# Patient Record
Sex: Female | Born: 1935
Health system: Southern US, Community
[De-identification: ages and names within clinical notes are randomized; demographics above are authoritative.]

## PROBLEM LIST (undated history)

## (undated) DIAGNOSIS — I639 Cerebral infarction, unspecified: Secondary | ICD-10-CM

## (undated) DIAGNOSIS — M1990 Unspecified osteoarthritis, unspecified site: Secondary | ICD-10-CM

## (undated) DIAGNOSIS — M810 Age-related osteoporosis without current pathological fracture: Secondary | ICD-10-CM

## (undated) DIAGNOSIS — I10 Essential (primary) hypertension: Secondary | ICD-10-CM

## (undated) DIAGNOSIS — M109 Gout, unspecified: Secondary | ICD-10-CM

## (undated) DIAGNOSIS — J45909 Unspecified asthma, uncomplicated: Secondary | ICD-10-CM

## (undated) DIAGNOSIS — K5790 Diverticulosis of intestine, part unspecified, without perforation or abscess without bleeding: Secondary | ICD-10-CM

## (undated) HISTORY — DX: Age-related osteoporosis without current pathological fracture: M81.0

## (undated) HISTORY — PX: APPENDECTOMY: SHX54

## (undated) HISTORY — DX: Gout, unspecified: M10.9

## (undated) HISTORY — DX: Unspecified asthma, uncomplicated: J45.909

## (undated) HISTORY — DX: Diverticulosis of intestine, part unspecified, without perforation or abscess without bleeding: K57.90

---

## 1999-05-27 ENCOUNTER — Encounter: Admission: RE | Admit: 1999-05-27 | Discharge: 1999-05-27 | Payer: Self-pay | Admitting: Family Medicine

## 1999-05-27 ENCOUNTER — Encounter: Payer: Self-pay | Admitting: Family Medicine

## 2001-01-15 ENCOUNTER — Encounter: Admission: RE | Admit: 2001-01-15 | Discharge: 2001-01-15 | Payer: Self-pay | Admitting: Family Medicine

## 2001-01-15 ENCOUNTER — Encounter: Payer: Self-pay | Admitting: Family Medicine

## 2001-03-05 ENCOUNTER — Encounter: Admission: RE | Admit: 2001-03-05 | Discharge: 2001-06-03 | Payer: Self-pay | Admitting: Family Medicine

## 2002-03-08 ENCOUNTER — Encounter: Payer: Self-pay | Admitting: Family Medicine

## 2002-03-08 ENCOUNTER — Encounter: Admission: RE | Admit: 2002-03-08 | Discharge: 2002-03-08 | Payer: Self-pay | Admitting: Family Medicine

## 2003-01-08 ENCOUNTER — Encounter: Admission: RE | Admit: 2003-01-08 | Discharge: 2003-01-08 | Payer: Self-pay | Admitting: Family Medicine

## 2003-01-08 ENCOUNTER — Encounter: Payer: Self-pay | Admitting: Family Medicine

## 2003-03-19 ENCOUNTER — Encounter: Admission: RE | Admit: 2003-03-19 | Discharge: 2003-03-19 | Payer: Self-pay | Admitting: Family Medicine

## 2003-03-19 ENCOUNTER — Encounter: Payer: Self-pay | Admitting: Family Medicine

## 2004-04-02 ENCOUNTER — Encounter: Admission: RE | Admit: 2004-04-02 | Discharge: 2004-04-02 | Payer: Self-pay | Admitting: Family Medicine

## 2004-05-10 ENCOUNTER — Other Ambulatory Visit: Admission: RE | Admit: 2004-05-10 | Discharge: 2004-05-10 | Payer: Self-pay | Admitting: Obstetrics and Gynecology

## 2004-05-18 HISTORY — PX: COLONOSCOPY: SHX174

## 2004-05-26 ENCOUNTER — Ambulatory Visit: Payer: Self-pay | Admitting: Gastroenterology

## 2005-07-05 ENCOUNTER — Encounter: Admission: RE | Admit: 2005-07-05 | Discharge: 2005-07-05 | Payer: Self-pay | Admitting: Family Medicine

## 2006-07-06 ENCOUNTER — Encounter: Admission: RE | Admit: 2006-07-06 | Discharge: 2006-07-06 | Payer: Self-pay | Admitting: Family Medicine

## 2006-10-12 ENCOUNTER — Emergency Department (HOSPITAL_COMMUNITY): Admission: EM | Admit: 2006-10-12 | Discharge: 2006-10-12 | Payer: Self-pay | Admitting: Emergency Medicine

## 2007-07-18 ENCOUNTER — Encounter: Admission: RE | Admit: 2007-07-18 | Discharge: 2007-07-18 | Payer: Self-pay | Admitting: Family Medicine

## 2008-08-05 ENCOUNTER — Encounter: Admission: RE | Admit: 2008-08-05 | Discharge: 2008-08-05 | Payer: Self-pay | Admitting: Emergency Medicine

## 2009-09-22 ENCOUNTER — Encounter: Admission: RE | Admit: 2009-09-22 | Discharge: 2009-09-22 | Payer: Self-pay | Admitting: Emergency Medicine

## 2010-06-22 ENCOUNTER — Other Ambulatory Visit
Admission: RE | Admit: 2010-06-22 | Discharge: 2010-06-22 | Payer: Self-pay | Source: Home / Self Care | Admitting: Obstetrics and Gynecology

## 2010-11-19 ENCOUNTER — Other Ambulatory Visit: Payer: Self-pay | Admitting: Family Medicine

## 2010-11-19 DIAGNOSIS — Z1231 Encounter for screening mammogram for malignant neoplasm of breast: Secondary | ICD-10-CM

## 2010-11-25 ENCOUNTER — Ambulatory Visit
Admission: RE | Admit: 2010-11-25 | Discharge: 2010-11-25 | Disposition: A | Discharge status: Home / Self Care | Payer: Medicare Other | Source: Ambulatory Visit | Attending: Family Medicine | Admitting: Family Medicine

## 2010-11-25 DIAGNOSIS — Z1231 Encounter for screening mammogram for malignant neoplasm of breast: Secondary | ICD-10-CM

## 2011-06-15 ENCOUNTER — Encounter: Payer: Self-pay | Admitting: Gastroenterology

## 2012-01-06 ENCOUNTER — Other Ambulatory Visit: Payer: Self-pay | Admitting: Family Medicine

## 2012-01-06 DIAGNOSIS — Z1231 Encounter for screening mammogram for malignant neoplasm of breast: Secondary | ICD-10-CM

## 2012-01-27 ENCOUNTER — Ambulatory Visit: Payer: Medicare Other

## 2012-04-14 ENCOUNTER — Emergency Department (INDEPENDENT_AMBULATORY_CARE_PROVIDER_SITE_OTHER)
Admission: EM | Admit: 2012-04-14 | Discharge: 2012-04-14 | Disposition: A | Discharge status: Home / Self Care | Payer: Medicare Other | Source: Home / Self Care

## 2012-04-14 ENCOUNTER — Encounter (HOSPITAL_COMMUNITY): Payer: Self-pay | Admitting: Emergency Medicine

## 2012-04-14 ENCOUNTER — Emergency Department (INDEPENDENT_AMBULATORY_CARE_PROVIDER_SITE_OTHER): Payer: Medicare Other

## 2012-04-14 DIAGNOSIS — M542 Cervicalgia: Secondary | ICD-10-CM

## 2012-04-14 DIAGNOSIS — M549 Dorsalgia, unspecified: Secondary | ICD-10-CM

## 2012-04-14 HISTORY — DX: Essential (primary) hypertension: I10

## 2012-04-14 HISTORY — DX: Unspecified osteoarthritis, unspecified site: M19.90

## 2012-04-14 MED ORDER — MELOXICAM 7.5 MG PO TABS: 7.5000 mg | ORAL_TABLET | Freq: Every day | ORAL | Status: DC

## 2012-04-14 MED ORDER — CYCLOBENZAPRINE HCL 7.5 MG PO TABS: 7.5000 mg | ORAL_TABLET | Freq: Two times a day (BID) | ORAL | Status: DC | PRN

## 2012-04-14 MED ORDER — TRAMADOL HCL 50 MG PO TABS: 50.0000 mg | ORAL_TABLET | Freq: Four times a day (QID) | ORAL | Status: DC | PRN
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
## 2012-04-14 NOTE — ED Provider Notes (Signed)
Medical screening examination/treatment/procedure(s) were performed by resident physician or non-physician practitioner and as supervising physician I was immediately available for consultation/collaboration.   Joetta Delprado DOUGLAS MD.    Harmonii Karle D Thia Olesen, MD 04/14/12 1920 

## 2013-07-31 ENCOUNTER — Other Ambulatory Visit: Payer: Self-pay | Admitting: Family Medicine

## 2013-07-31 ENCOUNTER — Other Ambulatory Visit (HOSPITAL_COMMUNITY)
Admission: RE | Admit: 2013-07-31 | Discharge: 2013-07-31 | Disposition: A | Discharge status: Home / Self Care | Payer: Medicare FFS | Source: Ambulatory Visit | Attending: Family Medicine | Admitting: Family Medicine

## 2013-07-31 DIAGNOSIS — Z124 Encounter for screening for malignant neoplasm of cervix: Secondary | ICD-10-CM | POA: Insufficient documentation

## 2013-07-31 DIAGNOSIS — Z1151 Encounter for screening for human papillomavirus (HPV): Secondary | ICD-10-CM | POA: Insufficient documentation

## 2014-03-07 ENCOUNTER — Other Ambulatory Visit: Payer: Self-pay | Admitting: Family Medicine

## 2014-03-07 ENCOUNTER — Ambulatory Visit
Admission: RE | Admit: 2014-03-07 | Discharge: 2014-03-07 | Disposition: A | Discharge status: Home / Self Care | Payer: Medicare FFS | Source: Ambulatory Visit | Attending: Family Medicine | Admitting: Family Medicine

## 2014-03-07 DIAGNOSIS — R439 Unspecified disturbances of smell and taste: Secondary | ICD-10-CM

## 2014-03-12 ENCOUNTER — Encounter: Payer: Self-pay | Admitting: Gastroenterology

## 2014-03-28 ENCOUNTER — Encounter: Payer: Self-pay | Admitting: Gastroenterology

## 2014-05-06 ENCOUNTER — Encounter: Payer: Self-pay | Admitting: *Deleted

## 2014-05-14 ENCOUNTER — Ambulatory Visit (AMBULATORY_SURGERY_CENTER): Payer: Medicare FFS | Admitting: *Deleted

## 2014-05-14 VITALS — Ht 65.0 in | Wt 190.0 lb

## 2014-05-14 DIAGNOSIS — Z1211 Encounter for screening for malignant neoplasm of colon: Secondary | ICD-10-CM

## 2014-05-14 MED ORDER — NA SULFATE-K SULFATE-MG SULF 17.5-3.13-1.6 GM/177ML PO SOLN: ORAL | Status: DC

## 2014-05-14 NOTE — Progress Notes (Signed)
Patient denies any allergies to eggs or soy. Patient denies any problems with anesthesia/sedation. Patient denies any oxygen use at home and does not take any diet/weight loss medications. EMMI education assisgned to patient on colonoscopy, this was explained and instructions given to patient. 

## 2014-05-28 ENCOUNTER — Encounter: Payer: Medicare FFS | Admitting: Gastroenterology

## 2014-06-05 ENCOUNTER — Encounter: Payer: Self-pay | Admitting: Gastroenterology

## 2014-08-07 ENCOUNTER — Encounter: Payer: Medicare FFS | Admitting: Gastroenterology

## 2014-08-21 ENCOUNTER — Ambulatory Visit (AMBULATORY_SURGERY_CENTER): Payer: Self-pay | Admitting: *Deleted

## 2014-08-21 VITALS — Ht 65.0 in | Wt 183.8 lb

## 2014-08-21 DIAGNOSIS — Z1211 Encounter for screening for malignant neoplasm of colon: Secondary | ICD-10-CM

## 2014-08-21 NOTE — Progress Notes (Signed)
No egg or soy allergy No diet pills No home 02 use No issues with past sedation Has emmi video from last PV Pt has a suprep at home from 04-2014 PV

## 2014-09-04 ENCOUNTER — Encounter: Payer: Self-pay | Admitting: Gastroenterology

## 2014-09-04 ENCOUNTER — Ambulatory Visit (AMBULATORY_SURGERY_CENTER): Payer: Medicare FFS | Admitting: Gastroenterology

## 2014-09-04 VITALS — BP 113/59 | HR 51 | Temp 98.0°F | Resp 16 | Ht 65.0 in | Wt 183.0 lb

## 2014-09-04 DIAGNOSIS — K648 Other hemorrhoids: Secondary | ICD-10-CM

## 2014-09-04 DIAGNOSIS — D125 Benign neoplasm of sigmoid colon: Secondary | ICD-10-CM

## 2014-09-04 DIAGNOSIS — Z1211 Encounter for screening for malignant neoplasm of colon: Secondary | ICD-10-CM

## 2014-09-04 DIAGNOSIS — K5730 Diverticulosis of large intestine without perforation or abscess without bleeding: Secondary | ICD-10-CM

## 2014-09-04 MED ORDER — SODIUM CHLORIDE 0.9 % IV SOLN: 500.0000 mL | INTRAVENOUS | Status: DC

## 2014-09-04 NOTE — Op Note (Signed)
[place]  [address] [place], [address]   COLONOSCOPY PROCEDURE REPORT  PATIENT: Kelly, Bates  MR#: [id number] BIRTHDATE: 1935-12-04 , 79  yrs. old GENDER: female ENDOSCOPIST: Inda Castle, MD REFERRED BY: PROCEDURE DATE:  09/04/2014 PROCEDURE:   Colonoscopy with snare polypectomy and Colonoscopy with control of bleeding First Screening Colonoscopy - Avg.  risk and is 50 yrs.  old or older - No.  Prior Negative Screening - Now for repeat screening. 10 or more years since last screening  History of Adenoma - Now for follow-up colonoscopy & has been > or = to 3 yrs.  N/A  Polyps Removed Today? Yes. ASA CLASS:   Class II INDICATIONS:average risk patient for colon cancer. MEDICATIONS: Monitored anesthesia care and Propofol 280 mg IV  DESCRIPTION OF PROCEDURE:   After the risks benefits and alternatives of the procedure were thoroughly explained, informed consent was obtained.  The digital rectal exam revealed no abnormalities of the rectum.   The LB [id number]  endoscope was introduced through the anus and advanced to the cecum, which was identified by both the appendix and ileocecal valve. No adverse events experienced.   The quality of the prep was excellent using Suprep  The instrument was then slowly withdrawn as the colon was fully examined.      COLON FINDINGS: A sessile polyp measuring 15 mm in size was found in the sigmoid colon.  A polypectomy was performed with a cold snare. The resection was complete, the polyp tissue was completely retrieved and sent to histology. There was a moderate amount of pulsatibve bleeding at the polypectomy site.   Bleeding at the site was controlled using 2 (first clip deployed did not adhere). hemoclips.  One (1) placement was made.  There was minimal blood loss from maneuver subsiding by end of procedure.   There was mild diverticulosis noted at the cecum, in the ascending colon, and transverse  colon.   There was mild diverticulosis noted in the sigmoid colon.   Internal hemorrhoids were found.  Retroflexed views revealed no abnormalities. The time to cecum=4 minutes 11 seconds.  Withdrawal time=13 minutes 21 seconds.  The scope was withdrawn and the procedure completed. COMPLICATIONS: There were no immediate complications.  ENDOSCOPIC IMPRESSION: 1.   Sessile polyp was found in the sigmoid colon; polypectomy was performed with a cold snare; bleeding at the site was controlled using 2 (first clip deployed did not adhere).  hemoclips 2.   Mild diverticulosis was noted at the cecum, in the ascending colon, and transverse colon 3.   Mild diverticulosis was noted in the sigmoid colon 4.   Internal hemorrhoids  RECOMMENDATIONS: 1.  Hold Aspirin and all other NSAIDS for 2 weeks. 2.  If the polyp(s) removed today are proven to be adenomatous (pre-cancerous) polyps, you will need a colonoscopy in 3 years. Otherwise you should continue to follow colorectal cancer screening guidelines for "routine risk" patients with a colonoscopy in 10 years.  You will receive a letter within 1-2 weeks with the results of your biopsy as well as final recommendations.  Please call my office if you have not received a letter after 3 weeks.  eSigned:  Inda Castle, MD 09/04/2014 11:48 AM   cc: Leta Speller, MD   PATIENT NAME:  Kelly, Bates MR#: [id number]

## 2014-09-04 NOTE — Patient Instructions (Addendum)
YOU HAD AN ENDOSCOPIC PROCEDURE TODAY AT [facility]: Refer to the procedure report that was given to you for any specific questions about what was found during the examination.  If the procedure report does not answer your questions, please call your gastroenterologist to clarify.  If you requested that your care partner not be given the details of your procedure findings, then the procedure report has been included in a sealed envelope for you to review at your convenience later.  YOU SHOULD EXPECT: Some feelings of bloating in the abdomen. Passage of more gas than usual.  Walking can help get rid of the air that was put into your GI tract during the procedure and reduce the bloating. If you had a lower endoscopy (such as a colonoscopy or flexible sigmoidoscopy) you may notice spotting of blood in your stool or on the toilet paper. If you underwent a bowel prep for your procedure, then you may not have a normal bowel movement for a few days.  DIET: Your first meal following the procedure should be a light meal and then it is ok to progress to your normal diet.  A half-sandwich or bowl of soup is an example of a good first meal.  Heavy or fried foods are harder to digest and may make you feel nauseous or bloated.  Likewise meals heavy in dairy and vegetables can cause extra gas to form and this can also increase the bloating.  Drink plenty of fluids but you should avoid alcoholic beverages for 24 hours. Try to increase the fiber in your diet.  ACTIVITY: Your care partner should take you home directly after the procedure.  You should plan to take it easy, moving slowly for the rest of the day.  You can resume normal activity the day after the procedure however you should NOT DRIVE or use heavy machinery for 24 hours (because of the sedation medicines used during the test).    SYMPTOMS TO REPORT IMMEDIATELY: A gastroenterologist can be reached at any hour.  During normal business hours, 8:30  AM to 5:00 PM Monday through Friday, call [phone].  After hours and on weekends, please call the GI answering service at [phone] who will take a message and have the physician on call contact you.   Following lower endoscopy (colonoscopy or flexible sigmoidoscopy):  Excessive amounts of blood in the stool  Significant tenderness or worsening of abdominal pains  Swelling of the abdomen that is new, acute  Fever of 100F or higher  FOLLOW UP: If any biopsies were taken you will be contacted by phone or by letter within the next 1-3 weeks.  Call your gastroenterologist if you have not heard about the biopsies in 3 weeks.  Our staff will call the home number listed on your records the next business day following your procedure to check on you and address any questions or concerns that you may have at that time regarding the information given to you following your procedure. This is a courtesy call and so if there is no answer at the home number and we have not heard from you through the emergency physician on call, we will assume that you have returned to your regular daily activities without incident.  SIGNATURES/CONFIDENTIALITY: You and/or your care partner have signed paperwork which will be entered into your electronic medical record.  These signatures attest to the fact that that the information above on your After Visit Summary has been reviewed and is understood.  Full responsibility of the confidentiality of this discharge information lies with you and/or your care-partner.  No NSAIDS for TWO WEEKS per Dr. Deatra Ina.  You also have a clip in your left intestine which will come out in your stool within a month or so. The recovery room nurse gave you a card with details. Please, keep it in your wallet.  Please, read the handouts given to you by the recovery room nurse.

## 2014-09-04 NOTE — Progress Notes (Signed)
Report to PACU, RN, vss, BBS= Clear.  

## 2014-09-04 NOTE — Progress Notes (Addendum)
Called to room to assist during endoscopic procedure.  Patient ID and intended procedure confirmed with present staff. Received instructions for my participation in the procedure from the performing physician.  

## 2014-09-05 ENCOUNTER — Telehealth: Payer: Self-pay | Admitting: *Deleted

## 2014-09-05 NOTE — Telephone Encounter (Signed)
  Follow up Call-  Call back number 09/04/2014  Post procedure Call Back phone  # [phone] hm  Permission to leave phone message Yes     Patient questions:  Do you have a fever, pain , or abdominal swelling? No. Pain Score  0 *  Have you tolerated food without any problems? Yes.    Have you been able to return to your normal activities? Yes.    Do you have any questions about your discharge instructions: Diet   No. Medications  No. Follow up visit  No.  Do you have questions or concerns about your Care? No.  Actions: * If pain score is 4 or above: No action needed, pain <4.

## 2014-09-14 ENCOUNTER — Encounter: Payer: Self-pay | Admitting: Gastroenterology

## 2015-01-28 DIAGNOSIS — R002 Palpitations: Secondary | ICD-10-CM | POA: Diagnosis not present

## 2015-01-28 DIAGNOSIS — R079 Chest pain, unspecified: Secondary | ICD-10-CM | POA: Diagnosis not present

## 2015-02-10 DIAGNOSIS — M1990 Unspecified osteoarthritis, unspecified site: Secondary | ICD-10-CM | POA: Diagnosis not present

## 2015-02-10 DIAGNOSIS — M810 Age-related osteoporosis without current pathological fracture: Secondary | ICD-10-CM | POA: Diagnosis not present

## 2015-02-10 DIAGNOSIS — F419 Anxiety disorder, unspecified: Secondary | ICD-10-CM | POA: Diagnosis not present

## 2015-02-10 DIAGNOSIS — E785 Hyperlipidemia, unspecified: Secondary | ICD-10-CM | POA: Diagnosis not present

## 2015-02-10 DIAGNOSIS — I10 Essential (primary) hypertension: Secondary | ICD-10-CM | POA: Diagnosis not present

## 2015-02-10 DIAGNOSIS — E119 Type 2 diabetes mellitus without complications: Secondary | ICD-10-CM | POA: Diagnosis not present

## 2015-04-10 DIAGNOSIS — L821 Other seborrheic keratosis: Secondary | ICD-10-CM | POA: Diagnosis not present

## 2015-04-10 DIAGNOSIS — L309 Dermatitis, unspecified: Secondary | ICD-10-CM | POA: Diagnosis not present

## 2015-04-22 DIAGNOSIS — H25012 Cortical age-related cataract, left eye: Secondary | ICD-10-CM | POA: Diagnosis not present

## 2015-04-22 DIAGNOSIS — H25011 Cortical age-related cataract, right eye: Secondary | ICD-10-CM | POA: Diagnosis not present

## 2015-04-22 DIAGNOSIS — H3530 Unspecified macular degeneration: Secondary | ICD-10-CM | POA: Diagnosis not present

## 2015-04-22 DIAGNOSIS — H40023 Open angle with borderline findings, high risk, bilateral: Secondary | ICD-10-CM | POA: Diagnosis not present

## 2015-04-22 DIAGNOSIS — H31011 Macula scars of posterior pole (postinflammatory) (post-traumatic), right eye: Secondary | ICD-10-CM | POA: Diagnosis not present

## 2015-04-24 DIAGNOSIS — L309 Dermatitis, unspecified: Secondary | ICD-10-CM | POA: Diagnosis not present

## 2015-05-05 DIAGNOSIS — Z23 Encounter for immunization: Secondary | ICD-10-CM | POA: Diagnosis not present

## 2015-05-05 DIAGNOSIS — J209 Acute bronchitis, unspecified: Secondary | ICD-10-CM | POA: Diagnosis not present

## 2015-06-04 DIAGNOSIS — H40023 Open angle with borderline findings, high risk, bilateral: Secondary | ICD-10-CM | POA: Diagnosis not present

## 2015-08-07 ENCOUNTER — Other Ambulatory Visit: Payer: Self-pay

## 2015-08-07 DIAGNOSIS — Z1231 Encounter for screening mammogram for malignant neoplasm of breast: Secondary | ICD-10-CM

## 2015-09-04 ENCOUNTER — Ambulatory Visit: Payer: Medicare FFS

## 2015-09-25 ENCOUNTER — Ambulatory Visit
Admission: RE | Admit: 2015-09-25 | Discharge: 2015-09-25 | Disposition: A | Discharge status: Home / Self Care | Payer: Medicare Other | Source: Ambulatory Visit

## 2015-09-25 DIAGNOSIS — Z1231 Encounter for screening mammogram for malignant neoplasm of breast: Secondary | ICD-10-CM

## 2015-10-23 ENCOUNTER — Ambulatory Visit (INDEPENDENT_AMBULATORY_CARE_PROVIDER_SITE_OTHER): Payer: Medicare Other | Admitting: Cardiology

## 2015-10-23 ENCOUNTER — Encounter: Payer: Self-pay | Admitting: Cardiology

## 2015-10-23 VITALS — BP 132/74 | HR 68 | Ht 65.0 in | Wt 182.4 lb

## 2015-10-23 DIAGNOSIS — Z720 Tobacco use: Secondary | ICD-10-CM | POA: Diagnosis not present

## 2015-10-23 DIAGNOSIS — E785 Hyperlipidemia, unspecified: Secondary | ICD-10-CM

## 2015-10-23 DIAGNOSIS — R0789 Other chest pain: Secondary | ICD-10-CM | POA: Diagnosis not present

## 2015-10-23 DIAGNOSIS — I10 Essential (primary) hypertension: Secondary | ICD-10-CM | POA: Insufficient documentation

## 2015-10-23 NOTE — Patient Instructions (Signed)
Medication Instructions:  The current medical regimen is effective;  continue present plan and medications.  Testing/Procedures: Your physician has requested that you have a lexiscan myoview. For further information please visit www.cardiosmart.org. Please follow instruction sheet, as given.  Follow-Up: Follow up as needed after testing.  If you need a refill on your cardiac medications before your next appointment, please call your pharmacy.  Thank you for choosing [facility]!!      

## 2015-10-23 NOTE — Progress Notes (Signed)
Cardiology Office Note    Date:  10/23/2015   ID:  Kelly Bates, DOB 1936/06/19, MRN [id number]  PCP:  Lujean Amel, MD  Cardiologist:   Candee Furbish, MD     History of Present Illness:  Kelly Bates is a 80 y.o. female here for evaluation of left-sided chest pain at the request of Dr. Dorthy Cooler pain occurred lasted about 5 minutes, woke her up, seemed to be agreeable by taking a deep breath. Intermittent over 2-3 min. Had one more episode. Feels like a nagging tooth ache at times. Has had intermittent smoking in the past, hyperlipidemia. She felt pounding, skipping. No exertion. Increased stress, son recently laid off. She lost another son to suicide. EKG was performed on 10/14/15 and showed no evidence of abnormality. No acute changes. She has diabetes as well. Echocardiogram in 2009 was normal.  Since that original episode she had one more episode. This concerned her.    Past Medical History  Diagnosis Date  . Diabetes mellitus   . Hypertension   . Arthritis   . Gout   . Diverticulosis   . Osteoporosis   . Asthma     pt denies    Past Surgical History  Procedure Laterality Date  . Appendectomy    . Colonoscopy  05-2004    Current Medications: Outpatient Prescriptions Prior to Visit  Medication Sig Dispense Refill  . aspirin 81 MG tablet Take 81 mg by mouth daily.    Marland Kitchen atenolol-chlorthalidone (TENORETIC) 50-25 MG per tablet Take 1 tablet by mouth daily.    . cetirizine (ZYRTEC) 10 MG tablet Take 10 mg by mouth daily.    Marland Kitchen lovastatin (MEVACOR) 40 MG tablet Take 40 mg by mouth at bedtime.    . metFORMIN (GLUCOPHAGE) 500 MG tablet Take 500 mg by mouth once.    . Omega-3 Fatty Acids (FISH OIL) 1000 MG CAPS Take by mouth daily with breakfast.    . traMADol (ULTRAM) 50 MG tablet Take 50 mg by mouth 2 (two) times daily as needed for moderate pain.     No facility-administered medications prior to visit.     Allergies:   Lipitor   Social History   Social History    . Marital Status: Divorced    Spouse Name: N/A  . Number of Children: N/A  . Years of Education: N/A   Social History Main Topics  . Smoking status: Current Every Day Smoker -- 0.50 packs/day    Types: Cigarettes  . Smokeless tobacco: Never Used  . Alcohol Use: 8.4 oz/week    14 Glasses of wine per week  . Drug Use: No  . Sexual Activity: Not Currently   Other Topics Concern  . None   Social History Narrative     Family History:  The patient's family history includes CVA in her father; Diabetes in her mother; Hypertension in her mother; Lung cancer in her father. There is no history of Colon cancer, Esophageal cancer, Rectal cancer, or Stomach cancer.   ROS:   Please see the history of present illness.    ROS All other systems reviewed and are negative.   PHYSICAL EXAM:   VS:  BP 132/74 mmHg  Pulse 68  Ht 5\' 5"  (1.651 m)  Wt 182 lb 6.4 oz (82.736 kg)  BMI 30.35 kg/m2   GEN: Well nourished, well developed, in no acute distress HEENT: normal Neck: no JVD, carotid bruits, or masses Cardiac: RRR; no murmurs, rubs, or gallops,no edema  Respiratory:  clear to auscultation bilaterally, normal work of breathing GI: soft, nontender, nondistended, + BS MS: no deformity or atrophy Skin: warm and dry, no rash Neuro:  Alert and Oriented x 3, Strength and sensation are intact Psych: euthymic mood, full affect  Wt Readings from Last 3 Encounters:  10/23/15 182 lb 6.4 oz (82.736 kg)  09/04/14 183 lb (83.008 kg)  08/21/14 183 lb 12.8 oz (83.371 kg)      Studies/Labs Reviewed:   EKG:  EKG is ordered today.  10/23/15-sinus rhythm, 68, no other abnormalities. Personally viewed-no change when compared to prior  Recent Labs: No results found for requested labs within last 365 days.   Lipid Panel No results found for: CHOL, TRIG, HDL, CHOLHDL, VLDL, LDLCALC, LDLDIRECT  Additional studies/ records that were reviewed today include:  Prior office notes reviewed, lab work reviewed,  EKGs reviewed.    ASSESSMENT:    1. Chest pain, atypical   2. Hyperlipidemia   3. Essential hypertension   4. Tobacco use      PLAN:  In order of problems listed above:  Atypical chest pain -Sounds like it may have been associated as well with palpitations, this worried her, she was about to call 911. Left-sided but did have some atypical features to it. We will go ahead and check a nuclear stress test for further ischemic evaluation. Her risk factors include diabetes, smoking, hypertension, hyperlipidemia.  Diabetes -On metformin. Primary team watching closely. This is a coronary disease equivalent.  Hyperlipidemia -Continue with statin therapy.  Smoking -Encourage tobacco cessation. She usually smokes in the evening with a drink.  Essential hypertension -Better control. Medications reviewed. Per primary team.  Medication Adjustments/Labs and Tests Ordered: Current medicines are reviewed at length with the patient today.  Concerns regarding medicines are outlined above.  Medication changes, Labs and Tests ordered today are listed in the Patient Instructions below. Patient Instructions  Medication Instructions:  The current medical regimen is effective;  continue present plan and medications.  Testing/Procedures: Your physician has requested that you have a lexiscan myoview. For further information please visit [facility]uge[facility]iest[facility][email or web address]. Please follow instruction sheet, as given.  Follow-Up: Follow up as needed after testing.  If you need a refill on your cardiac medications before your next appointment, please call your pharmacy.  Thank you for choosing [facility]!!           Signed, Candee Furbish, MD  10/23/2015 10:35 AM    [address], [place], [place]  [address] Phone: [phone]; Fax: [phone]

## 2015-10-28 ENCOUNTER — Telehealth (HOSPITAL_COMMUNITY): Payer: Self-pay | Admitting: *Deleted

## 2015-10-28 NOTE — Telephone Encounter (Signed)
Patient given detailed instructions per Myocardial Perfusion Study Information Sheet for the test on 4/14 /17. Patient notified to arrive 15 minutes early and that it is imperative to arrive on time for appointment to keep from having the test rescheduled.  If you need to cancel or reschedule your appointment, please call the office within 24 hours of your appointment. Failure to do so may result in a cancellation of your appointment, and a $50 no show fee. Patient verbalized understanding. Hubbard Robinson, RN

## 2015-10-30 ENCOUNTER — Ambulatory Visit (HOSPITAL_COMMUNITY): Payer: Medicare Other | Attending: Cardiovascular Disease

## 2015-10-30 DIAGNOSIS — R0789 Other chest pain: Secondary | ICD-10-CM | POA: Diagnosis not present

## 2015-10-30 DIAGNOSIS — I10 Essential (primary) hypertension: Secondary | ICD-10-CM | POA: Insufficient documentation

## 2015-10-30 LAB — MYOCARDIAL PERFUSION IMAGING
CHL CUP NUCLEAR SDS: 3
CHL CUP RESTING HR STRESS: 59 {beats}/min
LV sys vol: 37 mL
LVDIAVOL: 97 mL (ref 46–106)
Peak HR: 71 {beats}/min
RATE: 0.27
SRS: 1
SSS: 4
TID: 1

## 2015-10-30 MED ORDER — REGADENOSON 0.4 MG/5ML IV SOLN
0.4000 mg | Freq: Once | INTRAVENOUS | Status: AC
Start: 2015-10-30 — End: 2015-10-30
  Administered 2015-10-30: 0.4 mg via INTRAVENOUS

## 2015-10-30 MED ORDER — TECHNETIUM TC 99M SESTAMIBI GENERIC - CARDIOLITE
31.9000 | Freq: Once | INTRAVENOUS | Status: AC | PRN
  Administered 2015-10-30: 31.9 via INTRAVENOUS

## 2015-10-30 MED ORDER — TECHNETIUM TC 99M SESTAMIBI GENERIC - CARDIOLITE
10.2000 | Freq: Once | INTRAVENOUS | Status: AC | PRN
  Administered 2015-10-30: 10 via INTRAVENOUS

## 2015-11-04 ENCOUNTER — Telehealth: Payer: Self-pay | Admitting: Cardiology

## 2015-11-04 NOTE — Telephone Encounter (Signed)
Returning nurse call regarding Stress test.

## 2015-11-04 NOTE — Telephone Encounter (Signed)
Reviewed results of stress test with pt who stated understanding.  All questions answered.

## 2016-08-18 ENCOUNTER — Other Ambulatory Visit: Payer: Self-pay | Admitting: Family Medicine

## 2016-08-18 DIAGNOSIS — R079 Chest pain, unspecified: Secondary | ICD-10-CM

## 2016-08-23 ENCOUNTER — Ambulatory Visit
Admission: RE | Admit: 2016-08-23 | Discharge: 2016-08-23 | Disposition: A | Discharge status: Home / Self Care | Payer: Medicare Other | Source: Ambulatory Visit | Attending: Family Medicine | Admitting: Family Medicine

## 2016-08-23 DIAGNOSIS — R079 Chest pain, unspecified: Secondary | ICD-10-CM

## 2016-09-02 ENCOUNTER — Other Ambulatory Visit: Payer: Self-pay | Admitting: Family Medicine

## 2016-09-02 DIAGNOSIS — E041 Nontoxic single thyroid nodule: Secondary | ICD-10-CM

## 2016-09-08 ENCOUNTER — Ambulatory Visit
Admission: RE | Admit: 2016-09-08 | Discharge: 2016-09-08 | Disposition: A | Discharge status: Home / Self Care | Payer: Medicare Other | Source: Ambulatory Visit | Attending: Family Medicine | Admitting: Family Medicine

## 2016-09-08 DIAGNOSIS — E041 Nontoxic single thyroid nodule: Secondary | ICD-10-CM

## 2016-09-23 ENCOUNTER — Other Ambulatory Visit: Payer: Self-pay | Admitting: Family Medicine

## 2016-09-23 DIAGNOSIS — E041 Nontoxic single thyroid nodule: Secondary | ICD-10-CM

## 2016-09-28 ENCOUNTER — Other Ambulatory Visit: Payer: Self-pay | Admitting: Family Medicine

## 2016-09-28 DIAGNOSIS — Z1231 Encounter for screening mammogram for malignant neoplasm of breast: Secondary | ICD-10-CM

## 2016-10-11 ENCOUNTER — Ambulatory Visit
Admission: RE | Admit: 2016-10-11 | Discharge: 2016-10-11 | Disposition: A | Discharge status: Home / Self Care | Payer: Medicare Other | Source: Ambulatory Visit | Attending: Family Medicine | Admitting: Family Medicine

## 2016-10-11 ENCOUNTER — Other Ambulatory Visit (HOSPITAL_COMMUNITY)
Admission: RE | Admit: 2016-10-11 | Discharge: 2016-10-11 | Disposition: A | Discharge status: Home / Self Care | Payer: Medicare Other | Source: Ambulatory Visit | Attending: Radiology | Admitting: Radiology

## 2016-10-11 DIAGNOSIS — E041 Nontoxic single thyroid nodule: Secondary | ICD-10-CM | POA: Diagnosis present

## 2016-10-11 DIAGNOSIS — Z1231 Encounter for screening mammogram for malignant neoplasm of breast: Secondary | ICD-10-CM

## 2016-10-13 ENCOUNTER — Other Ambulatory Visit: Payer: Self-pay | Admitting: Family Medicine

## 2016-10-13 DIAGNOSIS — R928 Other abnormal and inconclusive findings on diagnostic imaging of breast: Secondary | ICD-10-CM

## 2016-10-21 ENCOUNTER — Other Ambulatory Visit: Payer: Medicare Other

## 2016-10-24 ENCOUNTER — Ambulatory Visit
Admission: RE | Admit: 2016-10-24 | Discharge: 2016-10-24 | Disposition: A | Discharge status: Home / Self Care | Payer: Medicare Other | Source: Ambulatory Visit | Attending: Family Medicine | Admitting: Family Medicine

## 2016-10-24 DIAGNOSIS — R928 Other abnormal and inconclusive findings on diagnostic imaging of breast: Secondary | ICD-10-CM

## 2017-02-16 ENCOUNTER — Other Ambulatory Visit: Payer: Self-pay | Admitting: Family Medicine

## 2017-02-16 DIAGNOSIS — R918 Other nonspecific abnormal finding of lung field: Secondary | ICD-10-CM

## 2017-04-10 ENCOUNTER — Ambulatory Visit
Admission: RE | Admit: 2017-04-10 | Discharge: 2017-04-10 | Disposition: A | Discharge status: Home / Self Care | Payer: Medicare Other | Source: Ambulatory Visit | Attending: Family Medicine | Admitting: Family Medicine

## 2017-04-10 DIAGNOSIS — R918 Other nonspecific abnormal finding of lung field: Secondary | ICD-10-CM

## 2017-04-27 ENCOUNTER — Emergency Department (HOSPITAL_COMMUNITY)
Admission: EM | Admit: 2017-04-27 | Discharge: 2017-04-27 | Disposition: A | Discharge status: Home / Self Care | Payer: Medicare Other | Attending: Emergency Medicine | Admitting: Emergency Medicine

## 2017-04-27 ENCOUNTER — Emergency Department (HOSPITAL_COMMUNITY): Payer: Medicare Other

## 2017-04-27 ENCOUNTER — Encounter (HOSPITAL_COMMUNITY): Payer: Self-pay | Admitting: Emergency Medicine

## 2017-04-27 DIAGNOSIS — J45909 Unspecified asthma, uncomplicated: Secondary | ICD-10-CM | POA: Insufficient documentation

## 2017-04-27 DIAGNOSIS — Y999 Unspecified external cause status: Secondary | ICD-10-CM | POA: Diagnosis not present

## 2017-04-27 DIAGNOSIS — S40911A Unspecified superficial injury of right shoulder, initial encounter: Secondary | ICD-10-CM | POA: Insufficient documentation

## 2017-04-27 DIAGNOSIS — Z7982 Long term (current) use of aspirin: Secondary | ICD-10-CM | POA: Diagnosis not present

## 2017-04-27 DIAGNOSIS — S20219A Contusion of unspecified front wall of thorax, initial encounter: Secondary | ICD-10-CM | POA: Diagnosis not present

## 2017-04-27 DIAGNOSIS — Z79899 Other long term (current) drug therapy: Secondary | ICD-10-CM | POA: Diagnosis not present

## 2017-04-27 DIAGNOSIS — E119 Type 2 diabetes mellitus without complications: Secondary | ICD-10-CM | POA: Insufficient documentation

## 2017-04-27 DIAGNOSIS — S20211A Contusion of right front wall of thorax, initial encounter: Secondary | ICD-10-CM

## 2017-04-27 DIAGNOSIS — Y92512 Supermarket, store or market as the place of occurrence of the external cause: Secondary | ICD-10-CM | POA: Insufficient documentation

## 2017-04-27 DIAGNOSIS — S20301A Unspecified superficial injuries of right front wall of thorax, initial encounter: Secondary | ICD-10-CM | POA: Diagnosis present

## 2017-04-27 DIAGNOSIS — W010XXA Fall on same level from slipping, tripping and stumbling without subsequent striking against object, initial encounter: Secondary | ICD-10-CM | POA: Insufficient documentation

## 2017-04-27 DIAGNOSIS — I10 Essential (primary) hypertension: Secondary | ICD-10-CM | POA: Insufficient documentation

## 2017-04-27 DIAGNOSIS — Z7984 Long term (current) use of oral hypoglycemic drugs: Secondary | ICD-10-CM | POA: Insufficient documentation

## 2017-04-27 DIAGNOSIS — Y939 Activity, unspecified: Secondary | ICD-10-CM | POA: Insufficient documentation

## 2017-04-27 DIAGNOSIS — S4990XA Unspecified injury of shoulder and upper arm, unspecified arm, initial encounter: Secondary | ICD-10-CM

## 2017-04-27 DIAGNOSIS — F17210 Nicotine dependence, cigarettes, uncomplicated: Secondary | ICD-10-CM | POA: Insufficient documentation

## 2017-04-27 MED ORDER — HYDROCODONE-ACETAMINOPHEN 5-325 MG PO TABS: 1.0000 | ORAL_TABLET | Freq: Two times a day (BID) | ORAL | 0 refills | Status: DC | PRN

## 2017-04-27 MED ORDER — HYDROCODONE-ACETAMINOPHEN 5-325 MG PO TABS
1.0000 | ORAL_TABLET | Freq: Once | ORAL | Status: AC
  Administered 2017-04-27: 1 via ORAL
  Filled 2017-04-27: qty 1

## 2017-04-27 MED ORDER — IBUPROFEN 400 MG PO TABS: 400.0000 mg | ORAL_TABLET | Freq: Three times a day (TID) | ORAL | 0 refills | Status: DC | PRN

## 2017-04-27 MED ORDER — METHOCARBAMOL 500 MG PO TABS: 500.0000 mg | ORAL_TABLET | Freq: Two times a day (BID) | ORAL | 0 refills | Status: DC

## 2017-04-27 MED ORDER — ACETAMINOPHEN 325 MG PO TABS: 650.0000 mg | ORAL_TABLET | Freq: Four times a day (QID) | ORAL | 0 refills | Status: DC | PRN

## 2017-04-27 NOTE — ED Notes (Signed)
PT states understanding of care given, follow up care, and medication prescribed. PT ambulated from ED to car with a steady gait. 

## 2017-04-27 NOTE — Discharge Instructions (Signed)
The x-rays show possible rib fractures. Please take the Tylenol round-the-clock for pain, along with rest of the medicine as needed for severe pain. Please use the incentive spirometer as instructed every 2 hours to prevent any lung infections. See your doctor in 1 week  Please be aware that the pain medicine and muscle relaxants will make you drowsy, so we extremely careful when walking and take all measures possible to prevent falls

## 2017-04-27 NOTE — ED Notes (Signed)
Patient is A&Ox4.  No signs of distress noted.  Please see providers complete history and physical exam.  

## 2017-04-27 NOTE — ED Triage Notes (Signed)
Patient tripped and fell on her knees at a grocery store today reports right shoulder joint pain radiating to right arm and bilateral knees pain , ambulatory/denies LOC .

## 2017-04-28 NOTE — ED Provider Notes (Signed)
[facility] DEPT Provider Note   CSN: [id number] Arrival date & time: 04/27/17  1911     History   Chief Complaint Chief Complaint  Patient presents with  . Fall    HPI Kelly AINA is a 81 y.o. female.  HPI Patient comes in with chief complaint of fall. Patient had a fall more than 4 hours ago while she was at a store. Patient reports that she tripped over wet tiles and fell forward. Patient did not hit her head, has no headaches, nausea, vomiting, seizures, loss of consciousness or new visual complains, weakness, numbness, dizziness or gait instability. Patient is having pain over her knees, right shoulder and right side of her chest. Pain in her chest is worse with inspiration.  Past Medical History:  Diagnosis Date  . Arthritis   . Asthma    pt denies  . Diabetes mellitus   . Diverticulosis   . Gout   . Hypertension   . Osteoporosis     Patient Active Problem List   Diagnosis Date Noted  . Chest pain, atypical 10/23/2015  . Hyperlipidemia 10/23/2015  . Essential hypertension 10/23/2015  . Tobacco use 10/23/2015    Past Surgical History:  Procedure Laterality Date  . APPENDECTOMY    . COLONOSCOPY  05-2004    OB History    No data available       Home Medications    Prior to Admission medications   Medication Sig Start Date End Date Taking? Authorizing Provider  alendronate (FOSAMAX) 70 MG tablet Take 70 mg by mouth once a week. 02/27/17  Yes [provider]  ALPRAZolam (XANAX) 0.25 MG tablet TAKE 0.25 mg TABLET BY MOUTH ONCE DAILY AS NEEDED FOR EXTREME ANXIETY 10/14/15  Yes [provider]  amLODipine (NORVASC) 2.5 MG tablet Take 2.5 mg by mouth daily 10/14/15  Yes [provider]  aspirin 81 MG tablet Take 81 mg by mouth daily.   Yes [provider]  atenolol-chlorthalidone (TENORETIC) 50-25 MG per tablet Take 1 tablet by mouth daily.   Yes [provider]  lovastatin (MEVACOR) 40 MG tablet Take 40 mg  by mouth daily.    Yes [provider]  metFORMIN (GLUCOPHAGE) 500 MG tablet Take 500 mg by mouth once.   Yes [provider]  Omega-3 Fatty Acids (FISH OIL) 1000 MG CAPS Take by mouth daily with breakfast.   Yes [provider]  OVER THE COUNTER MEDICATION Place 2 drops into both eyes daily as needed (dry eye). Nephcon eye drops   Yes [provider]  traMADol (ULTRAM) 50 MG tablet Take 50 mg by mouth 2 (two) times daily as needed for moderate pain. 04/14/12  Yes Chatten, Lolita Cram, NP  acetaminophen (TYLENOL) 325 MG tablet Take 2 tablets (650 mg total) by mouth every 6 (six) hours as needed. 04/27/17   Varney Biles, MD  HYDROcodone-acetaminophen (NORCO/VICODIN) 5-325 MG tablet Take 1 tablet by mouth every 12 (twelve) hours as needed for severe pain. 04/27/17   Varney Biles, MD  ibuprofen (ADVIL,MOTRIN) 400 MG tablet Take 1 tablet (400 mg total) by mouth every 8 (eight) hours as needed for moderate pain. 04/27/17   Varney Biles, MD  methocarbamol (ROBAXIN) 500 MG tablet Take 1 tablet (500 mg total) by mouth 2 (two) times daily. 04/27/17   Varney Biles, MD    Family History Family History  Problem Relation Age of Onset  . Diabetes Mother   . Hypertension Mother   . Lung cancer  Father   . CVA Father   . Colon cancer Neg Hx   . Esophageal cancer Neg Hx   . Rectal cancer Neg Hx   . Stomach cancer Neg Hx     Social History Social History  Substance Use Topics  . Smoking status: Current Every Day Smoker    Packs/day: 0.50    Types: Cigarettes  . Smokeless tobacco: Never Used  . Alcohol use 8.4 oz/week    14 Glasses of wine per week     Allergies   Lipitor [atorvastatin]   Review of Systems Review of Systems  Constitutional: Positive for activity change.  Respiratory: Negative for shortness of breath.   Cardiovascular: Positive for chest pain.  Gastrointestinal: Negative for abdominal pain.  Hematological: Does not bruise/bleed  easily.  All other systems reviewed and are negative.    Physical Exam Updated Vital Signs BP 134/68 (BP Location: Right Arm)   Pulse 68   Temp 98.1 F (36.7 C) (Oral)   Resp 18   Ht 5\' 5"  (1.651 m)   Wt 77.1 kg (170 lb)   SpO2 100%   BMI 28.29 kg/m   Physical Exam  Constitutional: She is oriented to person, place, and time. She appears well-developed.  HENT:  Head: Normocephalic and atraumatic.  Eyes: EOM are normal.  Neck: Normal range of motion. Neck supple.  No midline c-spine tenderness, pt able to turn head to 45 degrees bilaterally without any pain and able to flex neck to the chest and extend without any pain or neurologic symptoms.   Cardiovascular: Normal rate and intact distal pulses.   Pulmonary/Chest: Effort normal.  Abdominal: Bowel sounds are normal.  Musculoskeletal:  Right-sided chest wall tenderness, right-sided shoulder tenderness. Internal and external rotation of the shoulder is intact however limited due to pain.  Head to toe evaluation shows no hematoma, bleeding of the scalp, no facial abrasions, no spine step offs, crepitus of the chest or neck, no tenderness to palpation of the bilateral upper and lower extremities, no gross deformities, no chest tenderness, no pelvic pain.   Neurological: She is alert and oriented to person, place, and time.  Skin: Skin is warm and dry.  Nursing note and vitals reviewed.    ED Treatments / Results  Labs (all labs ordered are listed, but only abnormal results are displayed) Labs Reviewed - No data to display  EKG  EKG Interpretation None       Radiology Dg Ribs Unilateral W/chest Right  Result Date: 04/27/2017 CLINICAL DATA:  Trip and fall injury at 15:30. EXAM: RIGHT RIBS AND CHEST - 3+ VIEW COMPARISON:  04/10/2017 FINDINGS: Suspicious for nondisplaced fractures involving the anterior portions of mid right ribs. No pneumothorax. No effusion. Lungs are clear. Normal mediastinal contours. IMPRESSION:  Possible nondisplaced anterior rib fractures. No pneumothorax or effusion. Electronically Signed   By: Andreas Newport M.D.   On: 04/27/2017 22:15   Dg Shoulder Right  Result Date: 04/27/2017 CLINICAL DATA:  Fall today, pt states she mostly impacted her knees but also caught her fall with her right arm and is now experiencing pain and limited ROM in the right shoulder. Pt is unable to lift and extend the right arm for axillary imaging.Hx of osteoporosis. No hx of right shoulder injuries or surgeries. EXAM: RIGHT SHOULDER - 2+ VIEW COMPARISON:  None. FINDINGS: Advanced degenerative change at the acromioclavicular articulation. Diffuse osteopenia. No fracture or dislocation. IMPRESSION: 1. Negative for fracture or dislocation. 2. Osteopenia and acromioclavicular degenerative change. Electronically  Signed   By: Lucrezia Europe M.D.   On: 04/27/2017 20:03    Procedures Procedures (including critical care time)  Medications Ordered in ED Medications  HYDROcodone-acetaminophen (NORCO/VICODIN) 5-325 MG per tablet 1 tablet (1 tablet Oral Given 04/27/17 2341)     Initial Impression / Assessment and Plan / ED Course  I have reviewed the triage vital signs and the nursing notes.  Pertinent labs & imaging results that were available during my care of the patient were reviewed by me and considered in my medical decision making (see chart for details).  Clinical Course as of Apr 28 56  Thu Apr 27, 2017  2331 Results from the ER workup discussed with the patient face to face and all questions answered to the best of my ability.  DG Ribs Unilateral W/Chest Right [AN]    Clinical Course User Index [AN] Varney Biles, MD    Patient comes in with chief complaint of fall. Patient is not on any anticoagulant, denies striking her head, and has no red flags for severe head trauma. Patient's fall also occurred more than 4 hours ago prior to me seeing her therefore I feel comfortable clearing her brain along  with her C-spine clinically.  Patient is having tenderness over the right shoulder and the right part of her chest which is pleuritic pain. X-rays of the ribs show possible anterior fractures that are nondisplaced. We will treat the patient has if she has rib contusions versus minor rib fractures. Inspiratory spirometer has been provided. Primary care doctor follow-up has been stressed. Pain meds have been given for adequate analgesia and the risk of pain as discussed.   Final Clinical Impressions(s) / ED Diagnoses   Final diagnoses:  Rib contusion, right, initial encounter  Shoulder injury, initial encounter    New Prescriptions Discharge Medication List as of 04/27/2017 11:33 PM    START taking these medications   Details  acetaminophen (TYLENOL) 325 MG tablet Take 2 tablets (650 mg total) by mouth every 6 (six) hours as needed., Starting Thu 04/27/2017, Print    HYDROcodone-acetaminophen (NORCO/VICODIN) 5-325 MG tablet Take 1 tablet by mouth every 12 (twelve) hours as needed for severe pain., Starting Thu 04/27/2017, Print    ibuprofen (ADVIL,MOTRIN) 400 MG tablet Take 1 tablet (400 mg total) by mouth every 8 (eight) hours as needed for moderate pain., Starting Thu 04/27/2017, Print    methocarbamol (ROBAXIN) 500 MG tablet Take 1 tablet (500 mg total) by mouth 2 (two) times daily., Starting Thu 04/27/2017, Print         Varney Biles, MD 04/28/17 [phone]

## 2017-08-02 DIAGNOSIS — M75120 Complete rotator cuff tear or rupture of unspecified shoulder, not specified as traumatic: Secondary | ICD-10-CM | POA: Insufficient documentation

## 2017-08-30 ENCOUNTER — Encounter: Payer: Self-pay | Admitting: Gastroenterology

## 2018-02-26 IMAGING — DX DG SHOULDER 2+V*R*
2 series · 2 of 2 positions shown · non-contrast
Comparison: None.

CLINICAL DATA: Fall today, pt states she mostly impacted her knees
but also caught her fall with her right arm and is now experiencing
pain and limited ROM in the right shoulder. Pt is unable to lift and
extend the right arm for axillary imaging.Hx of osteoporosis. No hx
of right shoulder injuries or surgeries.

EXAM:
RIGHT SHOULDER - 2+ VIEW

[shoulder grashey]
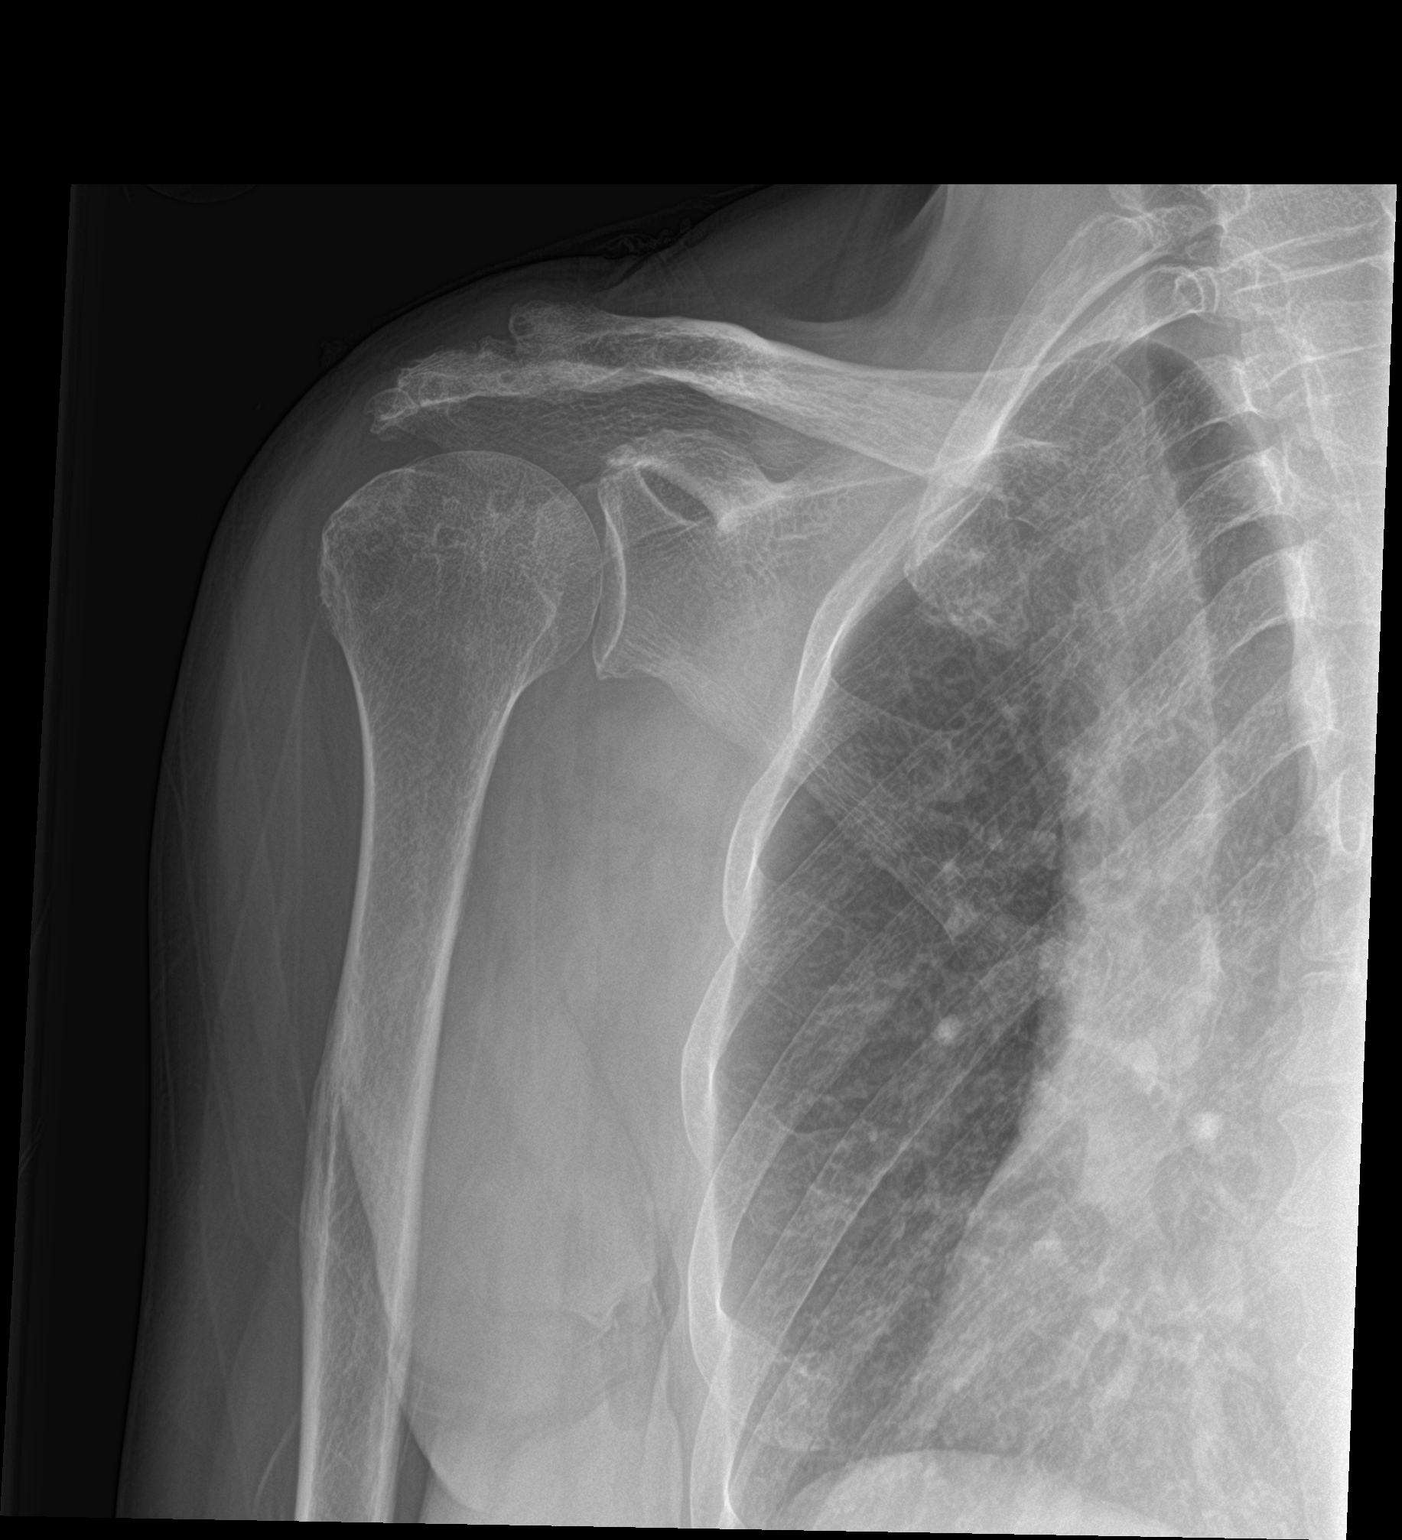

[shoulder y view]
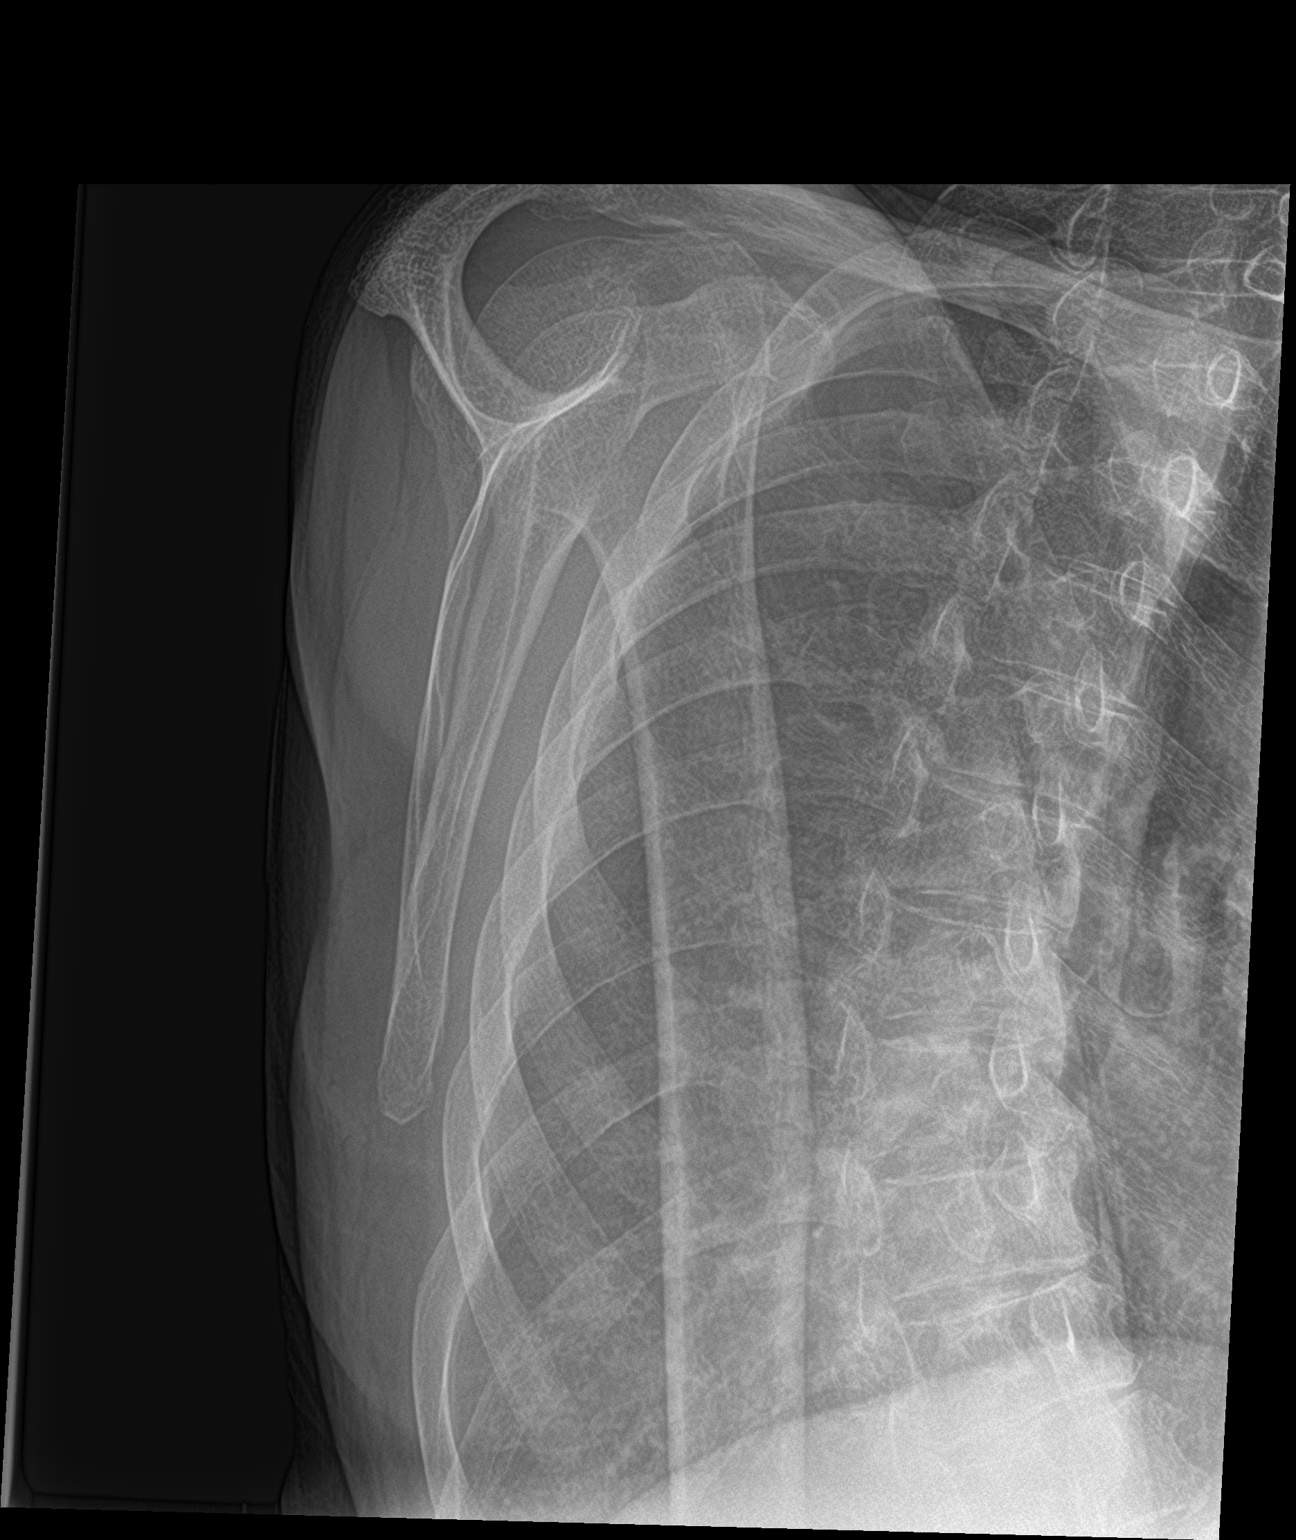

[2 of 2 positions shown; findings below may reference images not displayed]

FINDINGS: Advanced degenerative change at the acromioclavicular articulation.
Diffuse osteopenia. No fracture or dislocation.
IMPRESSION: 1. Negative for fracture or dislocation.
2. Osteopenia and acromioclavicular degenerative change.

## 2018-03-01 ENCOUNTER — Other Ambulatory Visit: Payer: Self-pay | Admitting: Family Medicine

## 2018-03-01 DIAGNOSIS — R911 Solitary pulmonary nodule: Secondary | ICD-10-CM

## 2018-03-09 ENCOUNTER — Ambulatory Visit
Admission: RE | Admit: 2018-03-09 | Discharge: 2018-03-09 | Disposition: A | Discharge status: Home / Self Care | Payer: Medicare Other | Source: Ambulatory Visit | Attending: Family Medicine | Admitting: Family Medicine

## 2018-03-09 DIAGNOSIS — R911 Solitary pulmonary nodule: Secondary | ICD-10-CM

## 2018-05-28 DIAGNOSIS — M5416 Radiculopathy, lumbar region: Secondary | ICD-10-CM | POA: Insufficient documentation

## 2018-09-10 ENCOUNTER — Other Ambulatory Visit: Payer: Self-pay | Admitting: Family Medicine

## 2018-09-10 DIAGNOSIS — E042 Nontoxic multinodular goiter: Secondary | ICD-10-CM

## 2019-01-30 ENCOUNTER — Ambulatory Visit: Payer: Medicare Other | Admitting: Podiatry

## 2019-01-30 ENCOUNTER — Other Ambulatory Visit: Payer: Self-pay

## 2019-01-30 ENCOUNTER — Encounter: Payer: Self-pay | Admitting: Podiatry

## 2019-01-30 DIAGNOSIS — M79675 Pain in left toe(s): Secondary | ICD-10-CM | POA: Diagnosis not present

## 2019-01-30 DIAGNOSIS — M79674 Pain in right toe(s): Secondary | ICD-10-CM

## 2019-01-30 DIAGNOSIS — E119 Type 2 diabetes mellitus without complications: Secondary | ICD-10-CM

## 2019-01-30 DIAGNOSIS — B351 Tinea unguium: Secondary | ICD-10-CM

## 2019-01-30 NOTE — Progress Notes (Signed)
This patient presents to the office with chief complaint of long thick nails and diabetic feet.  This patient  says there  is  no pain and discomfort in their feet.  This patient says there are long thick painful nails.  These nails are painful walking and wearing shoes.  Patient has no history of infection or drainage from both feet.  Patient is unable to  self treat his own nails . This patient presents  to the office today for treatment of the  long nails and a foot evaluation due to history of  diabetes.  General Appearance  Alert, conversant and in no acute stress.  Vascular  Dorsalis pedis and posterior tibial  pulses are palpable  bilaterally.  Capillary return is within normal limits  bilaterally. Temperature is within normal limits  bilaterally.  Neurologic  Senn-Weinstein monofilament wire test within normal limits  bilaterally. Muscle power within normal limits bilaterally.  Nails Thick disfigured discolored nails with subungual debris  from hallux to fifth toes bilaterally. No evidence of bacterial infection or drainage bilaterally.  Orthopedic  No limitations of motion of motion feet .  No crepitus or effusions noted.  No bony pathology or digital deformities noted.  Skin  normotropic skin with no porokeratosis noted bilaterally.  No signs of infections or ulcers noted.     Onychomycosis  Diabetes with no foot complications  IE  Debride nails x 10.  A diabetic foot exam was performed and there is no evidence of any vascular or neurologic pathology.   RTC 3 months.   Cayla Wiegand DPM  

## 2019-02-28 ENCOUNTER — Other Ambulatory Visit: Payer: Self-pay | Admitting: Family Medicine

## 2019-02-28 DIAGNOSIS — R911 Solitary pulmonary nodule: Secondary | ICD-10-CM

## 2019-03-13 ENCOUNTER — Ambulatory Visit
Admission: RE | Admit: 2019-03-13 | Discharge: 2019-03-13 | Disposition: A | Discharge status: Home / Self Care | Payer: Medicare Other | Source: Ambulatory Visit | Attending: Family Medicine | Admitting: Family Medicine

## 2019-03-13 DIAGNOSIS — R911 Solitary pulmonary nodule: Secondary | ICD-10-CM

## 2019-04-12 ENCOUNTER — Ambulatory Visit: Payer: Medicare Other | Admitting: Podiatry

## 2019-09-04 ENCOUNTER — Inpatient Hospital Stay (HOSPITAL_COMMUNITY)
Admission: EM | Admit: 2019-09-04 | Discharge: 2019-09-06 | DRG: 065 | Disposition: A | Discharge status: Home / Self Care | Payer: Medicare PPO | Attending: Internal Medicine | Admitting: Internal Medicine

## 2019-09-04 ENCOUNTER — Emergency Department (HOSPITAL_COMMUNITY): Payer: Medicare PPO

## 2019-09-04 ENCOUNTER — Inpatient Hospital Stay (HOSPITAL_COMMUNITY): Payer: Medicare PPO

## 2019-09-04 ENCOUNTER — Other Ambulatory Visit: Payer: Self-pay

## 2019-09-04 ENCOUNTER — Encounter (HOSPITAL_COMMUNITY): Payer: Self-pay | Admitting: Internal Medicine

## 2019-09-04 DIAGNOSIS — I480 Paroxysmal atrial fibrillation: Secondary | ICD-10-CM | POA: Diagnosis present

## 2019-09-04 DIAGNOSIS — I63332 Cerebral infarction due to thrombosis of left posterior cerebral artery: Secondary | ICD-10-CM | POA: Diagnosis not present

## 2019-09-04 DIAGNOSIS — I63411 Cerebral infarction due to embolism of right middle cerebral artery: Secondary | ICD-10-CM | POA: Diagnosis not present

## 2019-09-04 DIAGNOSIS — I4891 Unspecified atrial fibrillation: Secondary | ICD-10-CM | POA: Diagnosis not present

## 2019-09-04 DIAGNOSIS — F17210 Nicotine dependence, cigarettes, uncomplicated: Secondary | ICD-10-CM | POA: Diagnosis present

## 2019-09-04 DIAGNOSIS — R29702 NIHSS score 2: Secondary | ICD-10-CM | POA: Diagnosis present

## 2019-09-04 DIAGNOSIS — Z7984 Long term (current) use of oral hypoglycemic drugs: Secondary | ICD-10-CM | POA: Diagnosis not present

## 2019-09-04 DIAGNOSIS — I4892 Unspecified atrial flutter: Secondary | ICD-10-CM | POA: Diagnosis present

## 2019-09-04 DIAGNOSIS — M109 Gout, unspecified: Secondary | ICD-10-CM | POA: Diagnosis present

## 2019-09-04 DIAGNOSIS — Z7983 Long term (current) use of bisphosphonates: Secondary | ICD-10-CM | POA: Diagnosis not present

## 2019-09-04 DIAGNOSIS — Z20822 Contact with and (suspected) exposure to covid-19: Secondary | ICD-10-CM | POA: Diagnosis present

## 2019-09-04 DIAGNOSIS — I69351 Hemiplegia and hemiparesis following cerebral infarction affecting right dominant side: Secondary | ICD-10-CM

## 2019-09-04 DIAGNOSIS — I10 Essential (primary) hypertension: Secondary | ICD-10-CM | POA: Diagnosis present

## 2019-09-04 DIAGNOSIS — I361 Nonrheumatic tricuspid (valve) insufficiency: Secondary | ICD-10-CM

## 2019-09-04 DIAGNOSIS — Z79899 Other long term (current) drug therapy: Secondary | ICD-10-CM | POA: Diagnosis not present

## 2019-09-04 DIAGNOSIS — Z7982 Long term (current) use of aspirin: Secondary | ICD-10-CM

## 2019-09-04 DIAGNOSIS — Z79891 Long term (current) use of opiate analgesic: Secondary | ICD-10-CM

## 2019-09-04 DIAGNOSIS — Z823 Family history of stroke: Secondary | ICD-10-CM

## 2019-09-04 DIAGNOSIS — Z833 Family history of diabetes mellitus: Secondary | ICD-10-CM

## 2019-09-04 DIAGNOSIS — R270 Ataxia, unspecified: Secondary | ICD-10-CM | POA: Diagnosis present

## 2019-09-04 DIAGNOSIS — J45909 Unspecified asthma, uncomplicated: Secondary | ICD-10-CM | POA: Diagnosis present

## 2019-09-04 DIAGNOSIS — E042 Nontoxic multinodular goiter: Secondary | ICD-10-CM | POA: Diagnosis present

## 2019-09-04 DIAGNOSIS — E876 Hypokalemia: Secondary | ICD-10-CM | POA: Diagnosis not present

## 2019-09-04 DIAGNOSIS — M810 Age-related osteoporosis without current pathological fracture: Secondary | ICD-10-CM | POA: Diagnosis present

## 2019-09-04 DIAGNOSIS — Z8249 Family history of ischemic heart disease and other diseases of the circulatory system: Secondary | ICD-10-CM

## 2019-09-04 DIAGNOSIS — I6340 Cerebral infarction due to embolism of unspecified cerebral artery: Principal | ICD-10-CM | POA: Diagnosis present

## 2019-09-04 DIAGNOSIS — Z888 Allergy status to other drugs, medicaments and biological substances status: Secondary | ICD-10-CM

## 2019-09-04 DIAGNOSIS — I4819 Other persistent atrial fibrillation: Secondary | ICD-10-CM | POA: Diagnosis present

## 2019-09-04 DIAGNOSIS — E785 Hyperlipidemia, unspecified: Secondary | ICD-10-CM | POA: Diagnosis present

## 2019-09-04 DIAGNOSIS — E119 Type 2 diabetes mellitus without complications: Secondary | ICD-10-CM | POA: Diagnosis not present

## 2019-09-04 DIAGNOSIS — I340 Nonrheumatic mitral (valve) insufficiency: Secondary | ICD-10-CM | POA: Diagnosis not present

## 2019-09-04 DIAGNOSIS — I639 Cerebral infarction, unspecified: Secondary | ICD-10-CM | POA: Diagnosis present

## 2019-09-04 DIAGNOSIS — Z720 Tobacco use: Secondary | ICD-10-CM | POA: Diagnosis present

## 2019-09-04 HISTORY — DX: Cerebral infarction, unspecified: I63.9

## 2019-09-04 LAB — COMPREHENSIVE METABOLIC PANEL
ALT: 29 U/L (ref 0–44)
AST: 43 U/L — ABNORMAL HIGH (ref 15–41)
Albumin: 3.9 g/dL (ref 3.5–5.0)
Alkaline Phosphatase: 91 U/L (ref 38–126)
Anion gap: 15 (ref 5–15)
BUN: 13 mg/dL (ref 8–23)
CO2: 24 mmol/L (ref 22–32)
Calcium: 9.5 mg/dL (ref 8.9–10.3)
Chloride: 101 mmol/L (ref 98–111)
Creatinine, Ser: 0.63 mg/dL (ref 0.44–1.00)
GFR calc Af Amer: >60 mL/min (ref >60)
GFR calc non Af Amer: >60 mL/min (ref >60)
Glucose, Bld: 148 mg/dL — ABNORMAL HIGH (ref 70–99)
Potassium: 4 mmol/L (ref 3.5–5.1)
Sodium: 140 mmol/L (ref 135–145)
Total Bilirubin: 1.1 mg/dL (ref 0.3–1.2)
Total Protein: 6.6 g/dL (ref 6.5–8.1)

## 2019-09-04 LAB — CBC WITH DIFFERENTIAL/PLATELET
Abs Immature Granulocytes: 0.02 10*3/uL (ref 0.00–0.07)
Basophils Absolute: 0 10*3/uL (ref 0.0–0.1)
Basophils Relative: 1 %
Eosinophils Absolute: 0.2 10*3/uL (ref 0.0–0.5)
Eosinophils Relative: 3 %
HCT: 40.8 % (ref 36.0–46.0)
Hemoglobin: 13.2 g/dL (ref 12.0–15.0)
Immature Granulocytes: 0 %
Lymphocytes Relative: 28 %
Lymphs Abs: 2 10*3/uL (ref 0.7–4.0)
MCH: 31.1 pg (ref 26.0–34.0)
MCHC: 32.4 g/dL (ref 30.0–36.0)
MCV: 96.2 fL (ref 80.0–100.0)
Monocytes Absolute: 0.4 10*3/uL (ref 0.1–1.0)
Monocytes Relative: 6 %
Neutro Abs: 4.6 10*3/uL (ref 1.7–7.7)
Neutrophils Relative %: 62 %
Platelets: 242 10*3/uL (ref 150–400)
RBC: 4.24 MIL/uL (ref 3.87–5.11)
RDW: 13.2 % (ref 11.5–15.5)
WBC: 7.3 10*3/uL (ref 4.0–10.5)
nRBC: 0 % (ref 0.0–0.2)

## 2019-09-04 LAB — HEMOGLOBIN A1C
Hgb A1c MFr Bld: 6.8 % — ABNORMAL HIGH (ref 4.8–5.6)
Mean Plasma Glucose: 148.46 mg/dL

## 2019-09-04 LAB — PROTIME-INR
INR: 1 (ref 0.8–1.2)
Prothrombin Time: 13.4 seconds (ref 11.4–15.2)

## 2019-09-04 LAB — ECHOCARDIOGRAM COMPLETE
Height: 64 in
Weight: 2560 oz

## 2019-09-04 LAB — GLUCOSE, CAPILLARY
Glucose-Capillary: 139 mg/dL — ABNORMAL HIGH (ref 70–99)
Glucose-Capillary: 111 mg/dL — ABNORMAL HIGH (ref 70–99)

## 2019-09-04 LAB — TROPONIN I (HIGH SENSITIVITY)
Troponin I (High Sensitivity): 4 ng/L (ref <18)
Troponin I (High Sensitivity): 3 ng/L (ref <18)

## 2019-09-04 LAB — CBG MONITORING, ED: Glucose-Capillary: 149 mg/dL — ABNORMAL HIGH (ref 70–99)

## 2019-09-04 LAB — SARS CORONAVIRUS 2 (TAT 6-24 HRS): SARS Coronavirus 2: NEGATIVE

## 2019-09-04 MED ORDER — AMLODIPINE BESYLATE 2.5 MG PO TABS
2.5000 mg | ORAL_TABLET | Freq: Every day | ORAL | Status: DC
  Administered 2019-09-05 – 2019-09-06 (×2): 2.5 mg via ORAL
  Filled 2019-09-04 (×2): qty 1

## 2019-09-04 MED ORDER — STROKE: EARLY STAGES OF RECOVERY BOOK
Freq: Once | Status: AC
  Filled 2019-09-04: qty 1

## 2019-09-04 MED ORDER — PRAVASTATIN SODIUM 40 MG PO TABS
80.0000 mg | ORAL_TABLET | Freq: Every day | ORAL | Status: DC
  Administered 2019-09-04 – 2019-09-06 (×3): 80 mg via ORAL
  Filled 2019-09-04 (×3): qty 2

## 2019-09-04 MED ORDER — ACETAMINOPHEN 500 MG PO TABS: 1000.0000 mg | ORAL_TABLET | Freq: Four times a day (QID) | ORAL | Status: DC | PRN

## 2019-09-04 MED ORDER — POLYVINYL ALCOHOL 1.4 % OP SOLN: 1.0000 [drp] | OPHTHALMIC | Status: DC | PRN

## 2019-09-04 MED ORDER — TRAMADOL HCL 50 MG PO TABS
50.0000 mg | ORAL_TABLET | Freq: Two times a day (BID) | ORAL | Status: DC | PRN
  Administered 2019-09-04 – 2019-09-05 (×2): 50 mg via ORAL
  Filled 2019-09-04 (×2): qty 1

## 2019-09-04 MED ORDER — ATENOLOL-CHLORTHALIDONE 100-25 MG PO TABS: 1.0000 | ORAL_TABLET | Freq: Every day | ORAL | Status: DC

## 2019-09-04 MED ORDER — ATENOLOL 50 MG PO TABS
100.0000 mg | ORAL_TABLET | Freq: Every day | ORAL | Status: DC
  Administered 2019-09-05 – 2019-09-06 (×2): 100 mg via ORAL
  Filled 2019-09-04 (×2): qty 2

## 2019-09-04 MED ORDER — CHLORTHALIDONE 25 MG PO TABS
25.0000 mg | ORAL_TABLET | Freq: Every day | ORAL | Status: DC
  Administered 2019-09-05 – 2019-09-06 (×2): 25 mg via ORAL
  Filled 2019-09-04 (×2): qty 1

## 2019-09-04 MED ORDER — INSULIN ASPART 100 UNIT/ML ~~LOC~~ SOLN: 0.0000 [IU] | Freq: Three times a day (TID) | SUBCUTANEOUS | Status: DC

## 2019-09-04 NOTE — ED Triage Notes (Signed)
Pt here from home for evaluation of numbness on R side that started yesterday at 12 noon. 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 Pt endorses dull headache.

## 2019-09-04 NOTE — ED Notes (Signed)
Hooked patient back up to the monitor patient is resting with call bell in reach 

## 2019-09-04 NOTE — H&P (Signed)
History and Physical:    Kelly Bates   [id number] DOB: Dec 01, 1935 DOA: 09/04/2019  Referring MD/provider: Dr. Stark Jock PCP: Lujean Amel, MD   Patient coming from: Home  Chief Complaint: Right-sided numbness for 24 hours  History of Present Illness:   Kelly Bates is an 84 y.o. 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 She attributed this to the stress associated with disciplining him.  She subsequently developed numbness in the entire right side of her body which she states goes from the top of her head all the way down to her feet.  She called EMS last night who checked her blood pressure and said that she was fine but offered to bring her to the hospital but she declined.  This morning she woke up and she said of the numbness was more dense and very bothersome to her so she came to the ED.  Patient notes she has been having palpitations and a sense of irregular heartbeat for several months now.  She was referred to a cardiologist who did what sounds like an echocardiogram which apparently was normal.  Does not sound like she ever had a loop or ambulatory EKG monitor.  At present patient continues to have headache as well as the numbness that she had before.  Denies any difficulty with gait or balance as far she is aware.  Denies dysarthria or difficulty swallowing.  No difficulty with comprehension or expression.  She denies any fevers chills malaise or any acute intercurrent illness.  No known exposure to Covid.  ED Course:  The patient was noted to have atrial fibrillation on routine EKG here.  MRI shows a subcentimeter left thalamic infarct.  ROS:   ROS   Review of Systems: General: Denies fever, chills, malaise,  Respiratory: Denies  cough, SOB at rest or hemoptysis GI: Denies nausea, vomiting, diarrhea or constipation GU: Denies dysuria, frequency or hematuria Blood/lymphatics: Denies easy bruising or bleeding   Past Medical History:   Past Medical History:  Diagnosis Date  . Arthritis   . Asthma    pt denies  . Diabetes mellitus   . Diverticulosis   . Gout   . Hypertension   . Osteoporosis     Past Surgical History:   Past Surgical History:  Procedure Laterality Date  . APPENDECTOMY    . COLONOSCOPY  05-2004    Social History:   Social History   Socioeconomic History  . Marital status: Divorced    Spouse name: Not on file  . Number of children: Not on file  . Years of education: Not on file  . Highest education level: Not on file  Occupational History  . Not on file  Tobacco Use  . Smoking status: Current Every Day Smoker    Packs/day: 0.50    Types: Cigarettes  . Smokeless tobacco: Never Used  Substance and Sexual Activity  . Alcohol use: Yes    Alcohol/week: 14.0 standard drinks    Types: 14 Glasses of wine per week  . Drug use: No  . Sexual activity: Not Currently  Other Topics Concern  . Not on file  Social History Narrative  . Not on file   Social Determinants of Health   Financial Resource Strain:   . Difficulty  of Paying Living Expenses: Not on file  Food Insecurity:   . Worried About Charity fundraiser in the Last Year: Not on file  . Ran Out of Food in the Last Year: Not on file  Transportation Needs:   . Lack of Transportation (Medical): Not on file  . Lack of Transportation (Non-Medical): Not on file  Physical Activity:   . Days of Exercise per Week: Not on file  . Minutes of Exercise per Session: Not on file  Stress:   . Feeling of Stress : Not on file  Social Connections:   . Frequency of Communication with Friends and Family: Not on file  . Frequency of Social Gatherings with Friends and Family: Not on file  . Attends Religious Services: Not on file  .  Active Member of Clubs or Organizations: Not on file  . Attends Archivist Meetings: Not on file  . Marital Status: Not on file  Intimate Partner Violence:   . Fear of Current or Ex-Partner: Not on file  . Emotionally Abused: Not on file  . Physically Abused: Not on file  . Sexually Abused: Not on file    Allergies   Lipitor [atorvastatin]  Family history:   Family History  Problem Relation Age of Onset  . Diabetes Mother   . Hypertension Mother   . Lung cancer Father   . CVA Father   . Colon cancer Neg Hx   . Esophageal cancer Neg Hx   . Rectal cancer Neg Hx   . Stomach cancer Neg Hx     Current Medications:   Prior to Admission medications   Medication Sig Start Date End Date Taking? Authorizing Provider  acetaminophen (TYLENOL) 325 MG tablet Take 2 tablets (650 mg total) by mouth every 6 (six) hours as needed. 04/27/17   Varney Biles, MD  alendronate (FOSAMAX) 70 MG tablet Take 70 mg by mouth once a week. 02/27/17   [provider]  ALPRAZolam (XANAX) 0.25 MG tablet TAKE 0.25 mg TABLET BY MOUTH ONCE DAILY AS NEEDED FOR EXTREME ANXIETY 10/14/15   [provider]  amLODipine (NORVASC) 2.5 MG tablet amlodipine 2.5 mg tablet  TK 1 T PO QD    [provider]  aspirin 81 MG tablet Take 81 mg by mouth daily.    [provider]  atenolol-chlorthalidone (TENORETIC) 100-25 MG tablet atenolol 100 mg-chlorthalidone 25 mg tablet    [provider]  etodolac (LODINE XL) 400 MG 24 hr tablet etodolac ER 400 mg tablet,extended release 24 hr    [provider]  HYDROcodone-acetaminophen (NORCO/VICODIN) 5-325 MG tablet Take 1 tablet by mouth every 12 (twelve) hours as needed for severe pain. 04/27/17   Varney Biles, MD  ibuprofen (ADVIL,MOTRIN) 400 MG tablet Take 1 tablet (400 mg total) by mouth every 8 (eight) hours as needed for moderate pain. 04/27/17   Varney Biles, MD  lovastatin (MEVACOR) 40 MG tablet Take 40 mg by  mouth daily.     [provider]  metFORMIN (GLUCOPHAGE) 500 MG tablet Take 500 mg by mouth once.    [provider]  methocarbamol (ROBAXIN) 500 MG tablet methocarbamol 500 mg tablet  TK 1 T PO BID    [provider]  mometasone (ELOCON) 0.1 % ointment APP EXT AA BID FOR 10 DAYS UTD THEN PRN EXT 10 DAYS 09/03/18   [provider]  Omega-3 Fatty Acids (FISH OIL) 1000 MG CAPS Take by mouth daily with breakfast.  [provider]  OVER THE COUNTER MEDICATION Place 2 drops into both eyes daily as needed (dry eye). Nephcon eye drops    [provider]  pneumococcal 13-valent conjugate vaccine (PREVNAR 13) SUSP injection Prevnar 13 (PF) 0.5 mL intramuscular syringe    [provider]  potassium chloride SA (K-DUR) 20 MEQ tablet potassium chloride ER 20 mEq tablet,extended release(part/cryst)    [provider]  traMADol (ULTRAM) 50 MG tablet tramadol 50 mg tablet  TK 1 T PO BID PRF PAIN    [provider]    Physical Exam:   Vitals:   09/04/19 0926 09/04/19 0933 09/04/19 1000  BP:  130/81 115/83  Pulse:  (!) 102 64  Resp:  19 14  Temp:  98.9 F (37.2 C)   TempSrc:  Oral   SpO2:  97% 100%  Weight: 72.6 kg    Height: 5\' 4"  (1.626 m)       Physical Exam: Blood pressure 115/83, pulse 64, temperature 98.9 F (37.2 C), temperature source Oral, resp. rate 14, height 5\' 4"  (1.626 m), weight 72.6 kg, SpO2 100 %. Gen: Tired appearing female looking younger than stated age lying in stretcher in no acute distress.  She is fully conversant and cooperative. Constitutional: Alert and awake, oriented x3, not in any acute distress. Eyes: sclera anicteric, conjunctive a bilaterally injected,  CVS: S1-S2 clear, no murmur rubs or gallops, no LE edema, normal pedal pulses  Respiratory: Creased air entry bilaterally but without any adventitious sounds. GI: NABS, soft, NT, ND, no palpable masses.  LE: No edema. No  cyanosis Neuro: A/O x 3, Moving all extremities equally with normal strength, she does have decreased sensation to light touch from her shoulder down to her toes right less than left.  Sensation on her face is equal bilaterally.  Other than that her cranial nerves seem intact.  I do not see any evidence of the Bell's palsy that is noted on her problem list.  Psych: patient is logical and coherent, judgement and insight appear normal, mood and affect appropriate to situation.   Data Review:    Labs: Basic Metabolic Panel: Recent Labs  Lab 09/04/19 0934  NA 140  K 4.0  CL 101  CO2 24  GLUCOSE 148*  BUN 13  CREATININE 0.63  CALCIUM 9.5   Liver Function Tests: Recent Labs  Lab 09/04/19 0934  AST 43*  ALT 29  ALKPHOS 91  BILITOT 1.1  PROT 6.6  ALBUMIN 3.9   No results for input(s): LIPASE, AMYLASE in the last 168 hours. No results for input(s): AMMONIA in the last 168 hours. CBC: Recent Labs  Lab 09/04/19 0934  WBC 7.3  NEUTROABS 4.6  HGB 13.2  HCT 40.8  MCV 96.2  PLT 242   Cardiac Enzymes: No results for input(s): CKTOTAL, CKMB, CKMBINDEX, TROPONINI in the last 168 hours.  BNP (last 3 results) No results for input(s): PROBNP in the last 8760 hours. CBG: Recent Labs  Lab 09/04/19 0931  GLUCAP 149*    Urinalysis No results found for: COLORURINE, APPEARANCEUR, LABSPEC, PHURINE, GLUCOSEU, HGBUR, BILIRUBINUR, KETONESUR, PROTEINUR, UROBILINOGEN, NITRITE, LEUKOCYTESUR    Radiographic Studies: MR BRAIN WO CONTRAST  Result Date: 09/04/2019 CLINICAL DATA:  Acute neuro deficit.  Right-sided numbness. EXAM: MRI HEAD WITHOUT CONTRAST TECHNIQUE: Multiplanar, multiecho pulse sequences of the brain and surrounding structures were obtained without intravenous contrast. COMPARISON:  None. FINDINGS: Brain: Acute infarct left thalamus measuring under 1 cm. No other acute infarct. Generalized atrophy. Multiple  small periventricular deep white matter hyperintensities  bilaterally most consistent with chronic microvascular ischemia. Negative for hemorrhage mass or fluid collection. Vascular: Normal arterial flow voids Skull and upper cervical spine: No focal skeletal lesion. Sinuses/Orbits: Mild mucosal edema paranasal sinuses. Negative orbit Other: None IMPRESSION: Subcentimeter acute infarct left thalamus. Atrophy and mild chronic microvascular ischemic change in the white matter. Electronically Signed   By: Franchot Gallo M.D.   On: 09/04/2019 11:30    EKG: Independently reviewed.  Atrial fibrillation, rate controlled with variable rate. Normal axis.  Poor R wave progression.  Nonspecific ST-T wave changes.  Poor baseline.   Assessment/Plan:   Active Problems:   Hyperlipidemia   Essential hypertension   Tobacco use   Diabetes mellitus without complication (HCC)   Stroke (cerebrum) (HCC)   Atrial fibrillation, new onset ([place])  84 year old female who is otherwise relatively healthy presents with acute left thalamic infarct and new diagnosis of atrial fibrillation found on EKG although she has been having intermittent palpitations for the past year or so.  STROKE Very likely to be embolic secondary to atrial fibrillation She is 24 hours out of onset of symptoms however will defer timing of anticoagulation to neurology recommendations. Carotid Dopplers and TTE are ordered. PT, OT, speech evaluation requested  ATRIAL FIBRILLATION Patient is already rate controlled, on atenolol at home Anticoagulation can be started in the morning or today per neurology  HTN Atenolol/chlorthalidone and amlodipine to be resumed in the morning  DM Hold Metformin CBG AC at bedtime with SSI request  HL Increase statin to pravastatin 80 mg daily    Other information:   DVT prophylaxis: SCD ordered, to coagulation timing per neurology recommendations Code Status: Full Family Communication:  Spoke with Son Annie Main Sprinks who is very worried that stress over her  grandson/his nephew has brought this on.  Disposition Plan: Home Consults called: Neurology Admission status: Inpatient  [facility]  If 7PM-7AM, please contact night-coverage [email or web address] Password [facility] 09/04/2019, 12:37 PM

## 2019-09-04 NOTE — ED Provider Notes (Signed)
[facility] EMERGENCY DEPARTMENT Provider Note   CSN: [id number] Arrival date & time: 09/04/19  [id number]     History Chief Complaint  Patient presents with  . Numbness    Kelly Bates is a 84 y.o. female.  Patient is a an 84 year old female with history of diabetes, asthma, hypertension. She presents today for evaluation of numbness. Patient states that at approximately noon yesterday she developed the sensation of numbness to her right arm, right leg, and right face. She denies weakness or visual disturbance. She denies any difficulty with her speech.  Patient with no prior history of stroke.    The history is provided by the patient.  Weakness Severity:  Mild Onset quality:  Sudden Duration:  22 hours Timing:  Constant Progression:  Unchanged Chronicity:  New Relieved by:  Nothing Worsened by:  Nothing Ineffective treatments:  None tried      Past Medical History:  Diagnosis Date  . Arthritis   . Asthma    pt denies  . Diabetes mellitus   . Diverticulosis   . Gout   . Hypertension   . Osteoporosis     Patient Active Problem List   Diagnosis Date Noted  . Pain due to onychomycosis of toenails of both feet 01/30/2019  . Diabetes mellitus without complication ([place]) [id number]  . Chest pain, atypical 10/23/2015  . Hyperlipidemia 10/23/2015  . Essential hypertension 10/23/2015  . Tobacco use 10/23/2015    Past Surgical History:  Procedure Laterality Date  . APPENDECTOMY    . COLONOSCOPY  05-2004     OB History   No obstetric history on file.     Family History  Problem Relation Age of Onset  . Diabetes Mother   . Hypertension Mother   . Lung cancer Father   . CVA Father   . Colon cancer Neg Hx   . Esophageal cancer Neg Hx   . Rectal cancer Neg Hx   . Stomach cancer Neg Hx     Social History   Tobacco Use  . Smoking status: Current Every Day Smoker    Packs/day: 0.50    Types: Cigarettes  . Smokeless tobacco: Never Used    Substance Use Topics  . Alcohol use: Yes    Alcohol/week: 14.0 standard drinks    Types: 14 Glasses of wine per week  . Drug use: No    Home Medications Prior to Admission medications   Medication Sig Start Date End Date Taking? Authorizing Provider  acetaminophen (TYLENOL) 325 MG tablet Take 2 tablets (650 mg total) by mouth every 6 (six) hours as needed. 04/27/17   Varney Biles, MD  alendronate (FOSAMAX) 70 MG tablet Take 70 mg by mouth once a week. 02/27/17   [provider]  ALPRAZolam (XANAX) 0.25 MG tablet TAKE 0.25 mg TABLET BY MOUTH ONCE DAILY AS NEEDED FOR EXTREME ANXIETY 10/14/15   [provider]  amLODipine (NORVASC) 2.5 MG tablet amlodipine 2.5 mg tablet  TK 1 T PO QD    [provider]  aspirin 81 MG tablet Take 81 mg by mouth daily.    [provider]  atenolol-chlorthalidone (TENORETIC) 100-25 MG tablet atenolol 100 mg-chlorthalidone 25 mg tablet    [provider]  etodolac (LODINE XL) 400 MG 24 hr tablet etodolac ER 400 mg tablet,extended release 24 hr    [provider]  HYDROcodone-acetaminophen (NORCO/VICODIN) 5-325 MG tablet Take 1 tablet by mouth every 12 (twelve) hours as needed for severe pain. 04/27/17  Varney Biles, MD  ibuprofen (ADVIL,MOTRIN) 400 MG tablet Take 1 tablet (400 mg total) by mouth every 8 (eight) hours as needed for moderate pain. 04/27/17   Varney Biles, MD  lovastatin (MEVACOR) 40 MG tablet Take 40 mg by mouth daily.     [provider]  metFORMIN (GLUCOPHAGE) 500 MG tablet Take 500 mg by mouth once.    [provider]  methocarbamol (ROBAXIN) 500 MG tablet methocarbamol 500 mg tablet  TK 1 T PO BID    [provider]  mometasone (ELOCON) 0.1 % ointment APP EXT AA BID FOR 10 DAYS UTD THEN PRN EXT 10 DAYS 09/03/18   [provider]  Omega-3 Fatty Acids (FISH OIL) 1000 MG CAPS Take by mouth daily with breakfast.    [provider]  OVER THE  COUNTER MEDICATION Place 2 drops into both eyes daily as needed (dry eye). Nephcon eye drops    [provider]  pneumococcal 13-valent conjugate vaccine (PREVNAR 13) SUSP injection Prevnar 13 (PF) 0.5 mL intramuscular syringe    [provider]  potassium chloride SA (K-DUR) 20 MEQ tablet potassium chloride ER 20 mEq tablet,extended release(part/cryst)    [provider]  traMADol (ULTRAM) 50 MG tablet tramadol 50 mg tablet  TK 1 T PO BID PRF PAIN    [provider]    Allergies    Lipitor [atorvastatin]  Review of Systems   Review of Systems  Neurological: Positive for weakness.  All other systems reviewed and are negative.   Physical Exam Updated Vital Signs BP 130/81 (BP Location: Right Arm)   Pulse (!) 102   Temp 98.9 F (37.2 C) (Oral)   Resp 19   Ht 5\' 4"  (1.626 m)   Wt 72.6 kg   SpO2 97%   BMI 27.46 kg/m   Physical Exam Vitals and nursing note reviewed.  Constitutional:      General: She is not in acute distress.    Appearance: She is well-developed. She is not diaphoretic.  HENT:     Head: Normocephalic and atraumatic.  Eyes:     Extraocular Movements: Extraocular movements intact.     Pupils: Pupils are equal, round, and reactive to light.  Cardiovascular:     Rate and Rhythm: Normal rate. Rhythm irregular.     Heart sounds: No murmur. No friction rub. No gallop.   Pulmonary:     Effort: Pulmonary effort is normal. No respiratory distress.     Breath sounds: Normal breath sounds. No wheezing.  Abdominal:     General: Bowel sounds are normal. There is no distension.     Palpations: Abdomen is soft.     Tenderness: There is no abdominal tenderness.  Musculoskeletal:        General: Normal range of motion.     Cervical back: Normal range of motion and neck supple.  Skin:    General: Skin is warm and dry.  Neurological:     General: No focal deficit present.     Mental Status: She is alert and oriented to person, place,  and time.     Cranial Nerves: No cranial nerve deficit.     Sensory: No sensory deficit.     Motor: No weakness.     ED Results / Procedures / Treatments   Labs (all labs ordered are listed, but only abnormal results are displayed) Labs Reviewed  CBG MONITORING, ED - Abnormal; Notable for the following components:      Result Value  Glucose-Capillary 149 (*)    All other components within normal limits  COMPREHENSIVE METABOLIC PANEL  CBC WITH DIFFERENTIAL/PLATELET  PROTIME-INR  TROPONIN I (HIGH SENSITIVITY)    EKG EKG Interpretation  Date/Time:  Wednesday September 04 2019 09:31:00 EST Ventricular Rate:  92 PR Interval:    QRS Duration: 91 QT Interval:  514 QTC Calculation: 547 R Axis:   15 Text Interpretation: Atrial fibrillation Paired ventricular premature complexes RSR' in V1 or V2, probably normal variant Borderline repolarization abnormality Prolonged QT interval Confirmed by Veryl Speak [phone]) on 09/04/2019 9:40:31 AM   Radiology No results found.  Procedures Procedures (including critical care time)  Medications Ordered in ED Medications - No data to display  ED Course  I have reviewed the triage vital signs and the nursing notes.  Pertinent labs & imaging results that were available during my care of the patient were reviewed by me and considered in my medical decision making (see chart for details).    MDM Rules/Calculators/A&P  Patient with history of hypertension and diabetes presenting with complaints of right arm, right leg, and right face numbness that began yesterday at approximately noon.  Patient's laboratory studies are unremarkable, but head CT does show a subcentimeter acute infarct in the left thalamus.  This finding was discussed with Dr. Leonel Ramsay.  Patient will be admitted to the hospitalist service for further care.  CRITICAL CARE Performed by: Veryl Speak Total critical care time: 35 minutes Critical care time was exclusive  of separately billable procedures and treating other patients. Critical care was necessary to treat or prevent imminent or life-threatening deterioration. Critical care was time spent personally by me on the following activities: development of treatment plan with patient and/or surrogate as well as nursing, discussions with consultants, evaluation of patient's response to treatment, examination of patient, obtaining history from patient or surrogate, ordering and performing treatments and interventions, ordering and review of laboratory studies, ordering and review of radiographic studies, pulse oximetry and re-evaluation of patient's condition.   Final Clinical Impression(s) / ED Diagnoses Final diagnoses:  None    Rx / DC Orders ED Discharge Orders    None       Veryl Speak, MD 09/04/19 1523

## 2019-09-04 NOTE — Progress Notes (Signed)
  Echocardiogram 2D Echocardiogram has been performed.  Kelly Bates 09/04/2019, 4:01 PM

## 2019-09-04 NOTE — Consult Note (Addendum)
Neurology Consultation  Reason for Consult: Stroke Referring Physician: Bonnell Public Tublu,  CC: Right-sided decreased sensation and decreased strength  History is obtained from: Patient  HPI: Kelly Bates is a 84 y.o. female with history of hypertension, diabetes.  Patient came in with increased weakness on the right side.  MRI was obtained which showed a acute left thalamic infarct.  Neurology was consulted for this.  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  She suddenly felt that her head felt funny and then she felt numb on her right arm.  She did not think anything of this at that time.  Later that night due to her leg becoming numb she called EMS who checked her blood pressure however she declined to come in at that time.  This morning upon waking she felt significantly weak on her right arm and leg and was having difficulty walking.  For this reason patient came to the ED.    Of note patient did mention that she has been having palpitations sensation of irregular heartbeats for several months now.  Patient was referred to cardiologist at that time.  I do see that there is an echocardiogram ordered but it appears that has not been done.  She states that she does take 2 baby aspirin a day, admits to smoking approximately 4 cigarettes a day and drinking 2 glasses of wine at night.  ED course  Relevant labs include -LDL and A1c pending MRI brain-which showed a subcentimeter acute infarct in left thalamus   LKW: 12:00 noon on 09/03/2019 tpa given?: no, out of window Premorbid modified Rankin scale (mRS): 0 NIH stroke score of 4   Past Medical History:  Diagnosis Date  . Arthritis   . Asthma    pt denies  . Diabetes mellitus   . Diverticulosis   . Gout   . Hypertension   . Osteoporosis     Family History  Problem Relation Age of Onset  . Diabetes Mother   . Hypertension Mother   . Lung  cancer Father   . CVA Father   . Colon cancer Neg Hx   . Esophageal cancer Neg Hx   . Rectal cancer Neg Hx   . Stomach cancer Neg Hx    Social History:   reports that she has been smoking cigarettes. She has been smoking about 0.50 packs per day. She has never used smokeless tobacco. She reports current alcohol use of about 14.0 standard drinks of alcohol per week. She reports that she does not use drugs.  Medications  Current Facility-Administered Medications:  .   stroke: mapping our early stages of recovery book, , Does not apply, Once, Vashti Hey, MD .  acetaminophen (TYLENOL) tablet 1,000 mg, 1,000 mg, Oral, Q6H PRN, Vashti Hey, MD .  Derrill Memo ON 09/05/2019] amLODipine (NORVASC) tablet 2.5 mg, 2.5 mg, Oral, Daily, Jamse Arn, Kyra Searles, MD .  Derrill Memo ON 09/05/2019] atenolol-chlorthalidone (TENORETIC) 100-25 MG per tablet 1 tablet, 1 tablet, Oral, Daily, Chatterjee, Dewaine Oats Tublu, MD .  insulin aspart (novoLOG) injection 0-6 Units, 0-6 Units, Subcutaneous, TID WC, Chatterjee, Dewaine Oats Tublu, MD .  polyvinyl alcohol (LIQUIFILM TEARS) 1.4 % ophthalmic solution 1 drop, 1 drop, Both Eyes, Q4H PRN, Bonnell Public Tublu, MD .  pravastatin (PRAVACHOL) tablet 80 mg, 80 mg, Oral, q1800, Bonnell Public Tublu, MD .  traMADol (ULTRAM) tablet 50 mg, 50 mg, Oral, Q12H PRN, Jamse Arn Kyra Searles, MD  Current  Outpatient Medications:  .  acetaminophen (TYLENOL) 500 MG tablet, Take 1,000 mg by mouth every 6 (six) hours as needed for mild pain or headache., Disp: , Rfl:  .  alendronate (FOSAMAX) 70 MG tablet, Take 70 mg by mouth once a week., Disp: , Rfl: 11 .  amLODipine (NORVASC) 2.5 MG tablet, Take 2.5 mg by mouth daily. , Disp: , Rfl:  .  aspirin 81 MG tablet, Take 162 mg by mouth daily. , Disp: , Rfl:  .  atenolol-chlorthalidone (TENORETIC) 100-25 MG tablet, Take 1 tablet by mouth daily. , Disp: , Rfl:  .  lovastatin (MEVACOR) 40 MG tablet, Take 40 mg by  mouth at bedtime. , Disp: , Rfl:  .  metFORMIN (GLUCOPHAGE) 500 MG tablet, Take 500 mg by mouth daily with breakfast. , Disp: , Rfl:  .  Multiple Vitamin (MULTIVITAMIN WITH MINERALS) TABS tablet, Take 1 tablet by mouth daily with breakfast., Disp: , Rfl:  .  polyvinyl alcohol (LIQUIFILM TEARS) 1.4 % ophthalmic solution, Place 1 drop into both eyes every 4 (four) hours as needed for dry eyes., Disp: , Rfl:  .  psyllium (REGULOID) 0.52 g capsule, Take 0.52 g by mouth daily with breakfast., Disp: , Rfl:  .  traMADol (ULTRAM) 50 MG tablet, Take 50 mg by mouth every 12 (twelve) hours as needed for moderate pain. , Disp: , Rfl:   ROS:   General ROS: negative for - chills, fatigue, fever, night sweats, weight gain or weight loss Psychological ROS: negative for - behavioral disorder, hallucinations, memory difficulties, mood swings or suicidal ideation Ophthalmic ROS: negative for - blurry vision, double vision, eye pain or loss of vision ENT ROS: negative for - epistaxis, nasal discharge, oral lesions, sore throat, tinnitus or vertigo Allergy and Immunology ROS: negative for - hives or itchy/watery eyes Hematological and Lymphatic ROS: negative for - bleeding problems, bruising or swollen lymph nodes Endocrine ROS: negative for - galactorrhea, hair pattern changes, polydipsia/polyuria or temperature intolerance Respiratory ROS: negative for - cough, hemoptysis, shortness of breath or wheezing Cardiovascular ROS: negative for - chest pain, dyspnea on exertion, edema or irregular heartbeat Gastrointestinal ROS: negative for - abdominal pain, diarrhea, hematemesis, nausea/vomiting or stool incontinence Genito-Urinary ROS: negative for - dysuria, hematuria, incontinence or urinary frequency/urgency Musculoskeletal ROS: Positive for -  muscular weakness Neurological ROS: as noted in HPI Dermatological ROS: negative for rash and skin lesion changes  Exam: Current vital signs: BP 112/73   Pulse 76    Temp 98.9 F (37.2 C) (Oral)   Resp 11   Ht 5\' 4"  (1.626 m)   Wt 72.6 kg   SpO2 95%   BMI 27.46 kg/m  Vital signs in last 24 hours: Temp:  [98.9 F (37.2 C)] 98.9 F (37.2 C) (02/17 0933) Pulse Rate:  [64-102] 76 (02/17 1300) Resp:  [11-19] 11 (02/17 1300) BP: (94-130)/(73-83) 112/73 (02/17 1300) SpO2:  [95 %-100 %] 95 % (02/17 1300) Weight:  [72.6 kg] 72.6 kg (02/17 0926)   Constitutional: Appears well-developed and well-nourished.  Psych: Affect appropriate to situation Eyes: No scleral injection HENT: No OP obstrucion Head: Normocephalic.  Cardiovascular: Normal rate and regular rhythm.  Respiratory: Effort normal, non-labored breathing GI: Soft.  No distension. There is no tenderness.  Skin: WDI  Neuro: Mental Status: Patient is awake, alert, oriented to person, place, month, year, and situation. Speech-intact naming, repeating, comprehension Patient is able to give a clear and coherent history. Cranial Nerves: II: Visual Fields are full.  III,IV, VI: EOMI  without ptosis or diploplia. Pupils equal, round and reactive to light V: Facial sensation is symmetric to temperature VII: Facial movement is symmetric.  VIII: hearing is intact to voice X: Palat elevates symmetrically XI: Shoulder shrug is symmetric. XII: tongue is midline without atrophy or fasciculations.  Motor: Tone is normal. Bulk is normal.  Right upper extremity 4/5, right lower extremity 4/5, left upper and lower extremity 5/5  (RUE has arthritis ad some of the weakness is baseline) Drift-positive for right upper and lower extremities Sensory: Sensation is decreased on the right arm and leg Deep Tendon Reflexes: 2+ and symmetric bilateral upper extremities, 3+ bilateral knee jerk with cross adductors Plantars: Toes are downgoing bilaterally.  Cerebellar: Left finger-nose and heel-to-shin within normal limits right finger-nose and heel-to-shin was dysmetric in proportion to weakness  Labs I have  reviewed labs in epic and the results pertinent to this consultation are:   CBC    Component Value Date/Time   WBC 7.3 09/04/2019 0934   RBC 4.24 09/04/2019 0934   HGB 13.2 09/04/2019 0934   HCT 40.8 09/04/2019 0934   PLT 242 09/04/2019 0934   MCV 96.2 09/04/2019 0934   MCH 31.1 09/04/2019 0934   MCHC 32.4 09/04/2019 0934   RDW 13.2 09/04/2019 0934   LYMPHSABS 2.0 09/04/2019 0934   MONOABS 0.4 09/04/2019 0934   EOSABS 0.2 09/04/2019 0934   BASOSABS 0.0 09/04/2019 0934    CMP     Component Value Date/Time   NA 140 09/04/2019 0934   K 4.0 09/04/2019 0934   CL 101 09/04/2019 0934   CO2 24 09/04/2019 0934   GLUCOSE 148 (H) 09/04/2019 0934   BUN 13 09/04/2019 0934   CREATININE 0.63 09/04/2019 0934   CALCIUM 9.5 09/04/2019 0934   PROT 6.6 09/04/2019 0934   ALBUMIN 3.9 09/04/2019 0934   AST 43 (H) 09/04/2019 0934   ALT 29 09/04/2019 0934   ALKPHOS 91 09/04/2019 0934   BILITOT 1.1 09/04/2019 0934   GFRNONAA >60 09/04/2019 0934   GFRAA >60 09/04/2019 0934    Lipid Panel  No results found for: CHOL, TRIG, HDL, CHOLHDL, VLDL, LDLCALC, LDLDIRECT   Imaging I have reviewed the images obtained:  MRI examination of the brain-acute infarct left thalamic region under 1 cm  Etta Quill PA-C [facility] [phone]  M-F  (9:00 am- 5:00 PM)  09/04/2019, 1:51 PM    Assessment: 84 year old female presented to the hospital with greater than 24-hour history of right arm and leg weakness along with decreased sensation.  MRI shows a left thalamic infarct.  Exam is positive for left arm and leg weakness, ataxia and decreased sensation. Likely small vessel etiology infarct Also found to have new onset afib  Impression: -Left thalamic infarct - likely small vessel etiology based on location -New onset afib - can not r/o embolic etiology of the stroke  Recommend -MRI of the brain without contrast -CTA of head neck -Transthoracic Echo,  [id number] for now-will need  anticoagulation in longer term due to afib new diagnosis). Full recs on anticoag plus/minus antiplatelet after work up completion per stroke team -Start  other high intensity statin as Lipitor causes bloating -BP goal: permissive HTN upto 220/120 mmHg -HBAIC and Lipid profile -Telemetry monitoring -Frequent neuro checks -NPO until passes stroke swallow screen -PT/OT -Smoking cessation counseling # please page stroke NP  Or  PA  Or MD from 8am -4 pm  as this patient from this time will be  followed by the stroke.  You can look them up on [email or web address]  [place]  Attending Neurohospitalist Addendum Patient seen and examined with APP/Resident. Agree with the history and physical as documented above. Agree with the plan as documented, which I helped formulate. I have independently reviewed the chart, obtained history, review of systems and examined the patient.I have personally reviewed pertinent head/neck/spine imaging (CT/MRI). Please feel free to call with any questions. --- Amie Portland, MD [facility] Pager: [phone]  If 7pm to 7am, please call on call as listed on AMION.

## 2019-09-05 ENCOUNTER — Inpatient Hospital Stay (HOSPITAL_COMMUNITY): Payer: Medicare PPO

## 2019-09-05 DIAGNOSIS — E119 Type 2 diabetes mellitus without complications: Secondary | ICD-10-CM

## 2019-09-05 DIAGNOSIS — I63332 Cerebral infarction due to thrombosis of left posterior cerebral artery: Secondary | ICD-10-CM

## 2019-09-05 DIAGNOSIS — I10 Essential (primary) hypertension: Secondary | ICD-10-CM

## 2019-09-05 DIAGNOSIS — E785 Hyperlipidemia, unspecified: Secondary | ICD-10-CM

## 2019-09-05 DIAGNOSIS — I4891 Unspecified atrial fibrillation: Secondary | ICD-10-CM

## 2019-09-05 LAB — LIPID PANEL
Cholesterol: 134 mg/dL (ref 0–200)
HDL: 73 mg/dL (ref >40)
LDL Cholesterol: 47 mg/dL (ref 0–99)
Total CHOL/HDL Ratio: 1.8 RATIO
Triglycerides: 70 mg/dL (ref <150)
VLDL: 14 mg/dL (ref 0–40)

## 2019-09-05 LAB — HEMOGLOBIN A1C
Hgb A1c MFr Bld: 6.7 % — ABNORMAL HIGH (ref 4.8–5.6)
Mean Plasma Glucose: 145.59 mg/dL

## 2019-09-05 LAB — CBC
HCT: 34.9 % — ABNORMAL LOW (ref 36.0–46.0)
Hemoglobin: 11.4 g/dL — ABNORMAL LOW (ref 12.0–15.0)
MCH: 30.8 pg (ref 26.0–34.0)
MCHC: 32.7 g/dL (ref 30.0–36.0)
MCV: 94.3 fL (ref 80.0–100.0)
Platelets: 200 10*3/uL (ref 150–400)
RBC: 3.7 MIL/uL — ABNORMAL LOW (ref 3.87–5.11)
RDW: 13.2 % (ref 11.5–15.5)
WBC: 6.6 10*3/uL (ref 4.0–10.5)
nRBC: 0 % (ref 0.0–0.2)

## 2019-09-05 LAB — GLUCOSE, CAPILLARY
Glucose-Capillary: 134 mg/dL — ABNORMAL HIGH (ref 70–99)
Glucose-Capillary: 135 mg/dL — ABNORMAL HIGH (ref 70–99)
Glucose-Capillary: 130 mg/dL — ABNORMAL HIGH (ref 70–99)
Glucose-Capillary: 150 mg/dL — ABNORMAL HIGH (ref 70–99)

## 2019-09-05 LAB — BASIC METABOLIC PANEL
Anion gap: 11 (ref 5–15)
BUN: 13 mg/dL (ref 8–23)
CO2: 26 mmol/L (ref 22–32)
Calcium: 9.1 mg/dL (ref 8.9–10.3)
Chloride: 103 mmol/L (ref 98–111)
Creatinine, Ser: 0.61 mg/dL (ref 0.44–1.00)
GFR calc Af Amer: >60 mL/min (ref >60)
GFR calc non Af Amer: >60 mL/min (ref >60)
Glucose, Bld: 152 mg/dL — ABNORMAL HIGH (ref 70–99)
Potassium: 3.1 mmol/L — ABNORMAL LOW (ref 3.5–5.1)
Sodium: 140 mmol/L (ref 135–145)

## 2019-09-05 LAB — MAGNESIUM: Magnesium: 1.9 mg/dL (ref 1.7–2.4)

## 2019-09-05 LAB — TSH: TSH: 1.781 u[IU]/mL (ref 0.350–4.500)

## 2019-09-05 MED ORDER — ASPIRIN 325 MG PO TABS
325.0000 mg | ORAL_TABLET | Freq: Every day | ORAL | Status: DC
  Administered 2019-09-05: 325 mg via ORAL
  Filled 2019-09-05: qty 1

## 2019-09-05 MED ORDER — APIXABAN 5 MG PO TABS
5.0000 mg | ORAL_TABLET | Freq: Two times a day (BID) | ORAL | Status: DC
  Administered 2019-09-05 – 2019-09-06 (×2): 5 mg via ORAL
  Filled 2019-09-05 (×2): qty 1

## 2019-09-05 MED ORDER — POTASSIUM CHLORIDE CRYS ER 20 MEQ PO TBCR
40.0000 meq | EXTENDED_RELEASE_TABLET | ORAL | Status: AC
  Administered 2019-09-05 (×2): 40 meq via ORAL
  Filled 2019-09-05 (×2): qty 2

## 2019-09-05 MED ORDER — IOHEXOL 350 MG/ML SOLN
100.0000 mL | Freq: Once | INTRAVENOUS | Status: AC | PRN
  Administered 2019-09-05: 100 mL via INTRAVENOUS

## 2019-09-05 NOTE — Evaluation (Addendum)
Physical Therapy Evaluation Patient Details Name: Kelly Bates MRN: [id number] DOB: [birth year] Today's Date: 09/05/2019   History of Present Illness  Pt is an 84 y.o. female admitted 09/04/19 with headache and R-side numbness; pt had initially called EMS but declined transfer to hospital, pt came to ED after symptoms worsened. MRI showed subcentimeter L thalamic infarct. Also noted to have afib. PMH includes HTN, DM, HLD.    Clinical Impression  Pt presents with an overall decrease in functional mobility secondary to above. PTA, pt independent and lives with son. Today, pt requiring consistent min-modA to maintain balance with mobility. Pt limited by R-side weakness and impaired sensation, poor balance strategies/postural reactions, and decreased safety awareness; pt at high risk for falls. Feel pt would be great candidate for intensive CIR-level therapies to maximize functional mobility and independence prior to return home; pt in agreement.     Follow Up Recommendations CIR;Supervision for mobility/OOB    Equipment Recommendations  (TBD)    Recommendations for Other Services       Precautions / Restrictions Precautions Precautions: Fall Restrictions Weight Bearing Restrictions: No      Mobility  Bed Mobility Overal bed mobility: Needs Assistance Bed Mobility: Supine to Sit     Supine to sit: Supervision     General bed mobility comments: Increased time and effort, RUE lagging behind but pt eventually attending to arm without cues  Transfers Overall transfer level: Needs assistance Equipment used: None Transfers: Sit to/from Stand Sit to Stand: Min assist;Mod assist         General transfer comment: MinA to assist trunk elevation standing without DME, modA to maintain stability when pt reaching for RW  Ambulation/Gait Ambulation/Gait assistance: Min assist;Mod assist Gait Distance (Feet): 100 Feet Assistive device: None;Rolling walker (2 wheeled) Gait  Pattern/deviations: Step-through pattern;Decreased stride length;Trunk flexed;Ataxic;Drifts right/left   Gait velocity interpretation: <1.8 ft/sec, indicate of risk for recurrent falls General Gait Details: Unsteady gait with and without DME; frequent min-modA to prevent LOB while ambulation. Pt with RLE lagging behind requiring cues to correct and stay inside RW; easily fatigued with exacerbation of physical and cognitive deficits, needing increased cues for sequencing and safety  Stairs            Wheelchair Mobility    Modified Rankin (Stroke Patients Only) Modified Rankin (Stroke Patients Only) Pre-Morbid Rankin Score: No symptoms Modified Rankin: Moderately severe disability     Balance Overall balance assessment: Needs assistance   Sitting balance-Leahy Scale: Fair       Standing balance-Leahy Scale: Poor Standing balance comment: Reliant on UE support and external assist                             Pertinent Vitals/Pain Pain Assessment: No/denies pain    Home Living Family/patient expects to be discharged to:: Private residence Living Arrangements: Children Available Help at Discharge: Family;Available PRN/intermittently Type of Home: House Home Access: Stairs to enter Entrance Stairs-Rails: Right Entrance Stairs-Number of Steps: 3-4 Home Layout: One level Home Equipment: None Additional Comments: Lives with son who works during day. Reports grandson in school (or currently is?) to be PTA    Prior Function Level of Independence: Independent         Comments: Independent; reports never needing DME     Hand Dominance   Dominant Hand: Right    Extremity/Trunk Assessment   Upper Extremity Assessment Upper Extremity Assessment: Defer to OT evaluation(bilateral shoulder ROM limitations  with h/o arthritis; new R-side decreased light touch, c/o numbness, weakness, dysmetric RUE)    Lower Extremity Assessment Lower Extremity Assessment: RLE  deficits/detail;LLE deficits/detail RLE Deficits / Details: Decreased light touch throughout RLE; hip flex 4/5, knee ext 4/5, flex 3/5, ankle DF/PF 4/5 RLE Sensation: decreased light touch;decreased proprioception LLE Deficits / Details: L hip flex 3/5, knee flex/ext 4/5, ankle 4/5       Communication   Communication: No difficulties  Cognition Arousal/Alertness: Awake/alert Behavior During Therapy: WFL for tasks assessed/performed Overall Cognitive Status: No family/caregiver present to determine baseline cognitive functioning Area of Impairment: Attention;Safety/judgement;Awareness;Problem solving                   Current Attention Level: Selective     Safety/Judgement: Decreased awareness of deficits;Decreased awareness of safety Awareness: Emergent Problem Solving: Requires verbal cues General Comments: Exacerbated with fatigue      General Comments      Exercises     Assessment/Plan    PT Assessment Patient needs continued PT services  PT Problem List Decreased strength;Decreased activity tolerance;Decreased balance;Decreased mobility;Decreased cognition;Decreased knowledge of use of DME;Decreased safety awareness;Decreased knowledge of precautions;Impaired sensation       PT Treatment Interventions DME instruction;Gait training;Stair training;Functional mobility training;Therapeutic activities;Therapeutic exercise;Balance training;Patient/family education;Neuromuscular re-education    PT Goals (Current goals can be found in the Care Plan section)  Acute Rehab PT Goals Patient Stated Goal: Hopeful for return home but understands need for post-acute rehab PT Goal Formulation: With patient Time For Goal Achievement: 09/19/19 Potential to Achieve Goals: Good    Frequency Min 4X/week   Barriers to discharge        Co-evaluation               AM-PAC PT "6 Clicks" Mobility  Outcome Measure Help needed turning from your back to your side while in a  flat bed without using bedrails?: None Help needed moving from lying on your back to sitting on the side of a flat bed without using bedrails?: A Little Help needed moving to and from a bed to a chair (including a wheelchair)?: A Little Help needed standing up from a chair using your arms (e.g., wheelchair or bedside chair)?: A Little Help needed to walk in hospital room?: A Lot Help needed climbing 3-5 steps with a railing? : A Lot 6 Click Score: 17    End of Session Equipment Utilized During Treatment: Gait belt Activity Tolerance: Patient tolerated treatment well Patient left: (in room with OT) Nurse Communication: Mobility status PT Visit Diagnosis: Other abnormalities of gait and mobility (R26.89);Other symptoms and signs involving the nervous system (R29.898);Difficulty in walking, not elsewhere classified (R26.2)    Time: [id number] PT Time Calculation (min) (ACUTE ONLY): 20 min   Charges:   PT Evaluation $PT Eval Moderate Complexity: [place], PT, DPT Acute Rehabilitation Services  Pager [phone] Office [phone]  Derry Lory 09/05/2019, 9:26 AM

## 2019-09-05 NOTE — Consult Note (Addendum)
Physical Medicine and Rehabilitation Consult   Reason for Consult: Stroke with functional deficits Referring Physician: Dr. Grandville Silos   HPI: Kelly Bates is a 84 y.o. female with history of T2DM, HTN, CVA, who was admitted on 09/04/19 with one day history of headaches followed by numbness on entire right side of the body after having an argument with family.  MRI brain done showing acute subcentimer infarct in left thalamus with generalized atrophy and small vessel disease.  Neurology revaluated patient and exam revealed LUE/LLE weakness with ataxia and sensory deficits.   2D echo done revealing LVF 55-60% with moderately dilated left atrium and mild to moderate MVR/TVR. Patient with history of irregular heartbeat for months and EKG showed evidence of A fib. Stroke felt to be embolic and work up underway. CTA head/neck done this am--results pending. PT evaluation completed revealing deficits in mobility due to RLE weakness with frequent LOB. CIR recommended due to functional deficits   Review of Systems  Constitutional: Negative for chills and fever.  HENT: Negative for congestion, hearing loss and nosebleeds.   Eyes: Negative for blurred vision, pain, discharge and redness.  Respiratory: Negative for cough, hemoptysis, shortness of breath and stridor.   Cardiovascular: Negative for chest pain and leg swelling.  Gastrointestinal: Negative for abdominal pain, heartburn, nausea and vomiting.  Genitourinary: Positive for frequency. Negative for dysuria.  Musculoskeletal: Negative for joint pain.  Skin: Negative.   Neurological: Positive for sensory change and focal weakness.  Psychiatric/Behavioral: Negative for depression.      Past Medical History:  Diagnosis Date  . Arthritis   . Asthma    pt denies  . Diabetes mellitus   . Diverticulosis   . Gout   . Hypertension   . Osteoporosis   . Stroke [facility])     Past Surgical History:  Procedure Laterality Date  .  APPENDECTOMY    . COLONOSCOPY  05-2004    Family History  Problem Relation Age of Onset  . Diabetes Mother   . Hypertension Mother   . Lung cancer Father   . CVA Father   . Colon cancer Neg Hx   . Esophageal cancer Neg Hx   . Rectal cancer Neg Hx   . Stomach cancer Neg Hx     Social History:  reports that she has been smoking cigarettes. She has been smoking about 0.50 packs per day. She has never used smokeless tobacco. She reports current alcohol use of about 14.0 standard drinks of alcohol per week. She reports that she does not use drugs.     Allergies  Allergen Reactions  . Lipitor [Atorvastatin]     Bloating    Medications Prior to Admission  Medication Sig Dispense Refill  . acetaminophen (TYLENOL) 500 MG tablet Take 1,000 mg by mouth every 6 (six) hours as needed for mild pain or headache.    . alendronate (FOSAMAX) 70 MG tablet Take 70 mg by mouth once a week.  11  . amLODipine (NORVASC) 2.5 MG tablet Take 2.5 mg by mouth daily.     Marland Kitchen aspirin 81 MG tablet Take 162 mg by mouth daily.     Marland Kitchen atenolol-chlorthalidone (TENORETIC) 100-25 MG tablet Take 1 tablet by mouth daily.     Marland Kitchen lovastatin (MEVACOR) 40 MG tablet Take 40 mg by mouth at bedtime.     . metFORMIN (GLUCOPHAGE) 500 MG tablet Take 500 mg by mouth daily with breakfast.     . Multiple Vitamin (MULTIVITAMIN WITH  MINERALS) TABS tablet Take 1 tablet by mouth daily with breakfast.    . polyvinyl alcohol (LIQUIFILM TEARS) 1.4 % ophthalmic solution Place 1 drop into both eyes every 4 (four) hours as needed for dry eyes.    Marland Kitchen psyllium (REGULOID) 0.52 g capsule Take 0.52 g by mouth daily with breakfast.    . traMADol (ULTRAM) 50 MG tablet Take 50 mg by mouth every 12 (twelve) hours as needed for moderate pain.       Home: Home Living Family/patient expects to be discharged to:: Private residence Living Arrangements: Children Available Help at Discharge: Family, Available PRN/intermittently Type of Home: House Home  Access: Stairs to enter [employer] of Steps: 3-4 Entrance Stairs-Rails: Right Home Layout: One level Bathroom Shower/Tub: Chiropodist: Standard Home Equipment: None Additional Comments: Lives with son who works during day. Reports grandson in school (or currently is?) to be PTA  Functional History: Prior Function Level of Independence: Independent Comments: Independent; reports never needing DME Functional Status:  Mobility: Bed Mobility Overal bed mobility: Needs Assistance Bed Mobility: Supine to Sit Supine to sit: Supervision General bed mobility comments: Increased time and effort, RUE lagging behind but pt eventually attending to arm without cues Transfers Overall transfer level: Needs assistance Equipment used: None Transfers: Sit to/from Stand Sit to Stand: Min assist, Mod assist General transfer comment: MinA to assist trunk elevation standing without DME, modA to maintain stability when pt reaching for RW Ambulation/Gait Ambulation/Gait assistance: Min assist, Mod assist Gait Distance (Feet): 100 Feet Assistive device: None, Rolling walker (2 wheeled) Gait Pattern/deviations: Step-through pattern, Decreased stride length, Trunk flexed, Ataxic, Drifts right/left General Gait Details: Unsteady gait with and without DME; frequent min-modA to prevent LOB while ambulation. Pt with RLE lagging behind requiring cues to correct and stay inside RW; easily fatigued with exacerbation of physical and cognitive deficits, needing increased cues for sequencing and safety Gait velocity interpretation: <1.8 ft/sec, indicate of risk for recurrent falls    ADL:    Cognition: Cognition Overall Cognitive Status: No family/caregiver present to determine baseline cognitive functioning Orientation Level: Oriented X4 Cognition Arousal/Alertness: Awake/alert Behavior During Therapy: WFL for tasks assessed/performed Overall Cognitive Status: No  family/caregiver present to determine baseline cognitive functioning Area of Impairment: Attention, Safety/judgement, Awareness, Problem solving Current Attention Level: Selective Safety/Judgement: Decreased awareness of deficits, Decreased awareness of safety Awareness: Emergent Problem Solving: Requires verbal cues General Comments: Exacerbated with fatigue   Blood pressure 129/72, pulse 65, temperature 98.6 F (37 C), temperature source Oral, resp. rate 18, height 5\' 4"  (1.626 m), weight 72.6 kg, SpO2 97 %. Physical Exam  Nursing note and vitals reviewed. Constitutional: She is oriented to person, place, and time. She appears well-developed and well-nourished.  Walking to BR with RW and NT.   HENT:  Head: Normocephalic and atraumatic.  Eyes: Pupils are equal, round, and reactive to light. Conjunctivae and EOM are normal.  Cardiovascular: Normal rate, regular rhythm and normal heart sounds.  Respiratory: Effort normal and breath sounds normal. No respiratory distress.  GI: Soft. Bowel sounds are normal. She exhibits no distension.  Musculoskeletal:        General: Normal range of motion.     Cervical back: Normal range of motion and neck supple.  Neurological: She is alert and oriented to person, place, and time.  Psychiatric: She has a normal mood and affect.  Motor strength 3 - in the right deltoid, 4 - at the bicep tricep grip 4/5 in the hip flexor knee  extensor ankle dorsiflexor 5/5 in the left upper and left lower limb  Results for orders placed or performed during the hospital encounter of 09/04/19 (from the past 24 hour(s))  Troponin I (High Sensitivity)     Status: None   Collection Time: 09/04/19 12:09 PM  Result Value Ref Range   Troponin I (High Sensitivity) 3 <18 ng/L  SARS CORONAVIRUS 2 (TAT 6-24 HRS) Nasopharyngeal Nasopharyngeal Swab     Status: None   Collection Time: 09/04/19 12:13 PM   Specimen: Nasopharyngeal Swab  Result Value Ref Range   SARS Coronavirus 2  NEGATIVE NEGATIVE  Glucose, capillary     Status: Abnormal   Collection Time: 09/04/19  5:19 PM  Result Value Ref Range   Glucose-Capillary 139 (H) 70 - 99 mg/dL   Comment 1 Notify RN   Glucose, capillary     Status: Abnormal   Collection Time: 09/04/19  9:36 PM  Result Value Ref Range   Glucose-Capillary 111 (H) 70 - 99 mg/dL  Hemoglobin A1c     Status: Abnormal   Collection Time: 09/05/19  2:47 AM  Result Value Ref Range   Hgb A1c MFr Bld 6.7 (H) 4.8 - 5.6 %   Mean Plasma Glucose 145.59 mg/dL  CBC     Status: Abnormal   Collection Time: 09/05/19  2:47 AM  Result Value Ref Range   WBC 6.6 4.0 - 10.5 K/uL   RBC 3.70 (L) 3.87 - 5.11 MIL/uL   Hemoglobin 11.4 (L) 12.0 - 15.0 g/dL   HCT 34.9 (L) 36.0 - 46.0 %   MCV 94.3 80.0 - 100.0 fL   MCH 30.8 26.0 - 34.0 pg   MCHC 32.7 30.0 - 36.0 g/dL   RDW 13.2 11.5 - 15.5 %   Platelets 200 150 - 400 K/uL   nRBC 0.0 0.0 - 0.2 %  Basic metabolic panel     Status: Abnormal   Collection Time: 09/05/19  2:47 AM  Result Value Ref Range   Sodium 140 135 - 145 mmol/L   Potassium 3.1 (L) 3.5 - 5.1 mmol/L   Chloride 103 98 - 111 mmol/L   CO2 26 22 - 32 mmol/L   Glucose, Bld 152 (H) 70 - 99 mg/dL   BUN 13 8 - 23 mg/dL   Creatinine, Ser 0.61 0.44 - 1.00 mg/dL   Calcium 9.1 8.9 - 10.3 mg/dL   GFR calc non Af Amer >60 >60 mL/min   GFR calc Af Amer >60 >60 mL/min   Anion gap 11 5 - 15  Lipid panel     Status: None   Collection Time: 09/05/19  2:47 AM  Result Value Ref Range   Cholesterol 134 0 - 200 mg/dL   Triglycerides 70 <150 mg/dL   HDL 73 >40 mg/dL   Total CHOL/HDL Ratio 1.8 RATIO   VLDL 14 0 - 40 mg/dL   LDL Cholesterol 47 0 - 99 mg/dL  Glucose, capillary     Status: Abnormal   Collection Time: 09/05/19  6:21 AM  Result Value Ref Range   Glucose-Capillary 134 (H) 70 - 99 mg/dL  Glucose, capillary     Status: Abnormal   Collection Time: 09/05/19 10:45 AM  Result Value Ref Range   Glucose-Capillary 135 (H) 70 - 99 mg/dL   DG Chest  2 View  Result Date: 09/04/2019 CLINICAL DATA:  Stroke. EXAM: CHEST - 2 VIEW COMPARISON:  Single view of the chest 04/27/2017. FINDINGS: There is cardiomegaly and mild vascular congestion. Atherosclerosis. No  consolidative process, pneumothorax or effusion. Scoliosis noted. IMPRESSION: Cardiomegaly and vascular congestion. Atherosclerosis. Electronically Signed   By: Inge Rise M.D.   On: 09/04/2019 14:20   MR BRAIN WO CONTRAST  Result Date: 09/04/2019 CLINICAL DATA:  Acute neuro deficit.  Right-sided numbness. EXAM: MRI HEAD WITHOUT CONTRAST TECHNIQUE: Multiplanar, multiecho pulse sequences of the brain and surrounding structures were obtained without intravenous contrast. COMPARISON:  None. FINDINGS: Brain: Acute infarct left thalamus measuring under 1 cm. No other acute infarct. Generalized atrophy. Multiple small periventricular deep white matter hyperintensities bilaterally most consistent with chronic microvascular ischemia. Negative for hemorrhage mass or fluid collection. Vascular: Normal arterial flow voids Skull and upper cervical spine: No focal skeletal lesion. Sinuses/Orbits: Mild mucosal edema paranasal sinuses. Negative orbit Other: None IMPRESSION: Subcentimeter acute infarct left thalamus. Atrophy and mild chronic microvascular ischemic change in the white matter. Electronically Signed   By: Franchot Gallo M.D.   On: 09/04/2019 11:30   ECHOCARDIOGRAM COMPLETE  Result Date: 09/04/2019    ECHOCARDIOGRAM REPORT   Patient Name:   Kelly Bates Date of Exam: 09/04/2019 Medical Rec #:  [id number]       Height:       64.0 in Accession #:    [id number]      Weight:       160.0 lb Date of Birth:  August 28, 1935        BSA:          1.78 m Patient Age:    79 years        BP:           112/73 mmHg Patient Gender: F               HR:           67 bpm. Exam Location:  Inpatient Procedure: 2D Echo Indications:    stroke 434.91  History:        Patient has no prior history of Echocardiogram  examinations.                 Arrythmias:Atrial Fibrillation; Risk Factors:Diabetes,                 Hypertension, Dyslipidemia and Current Smoker.  Sonographer:    Johny Chess Referring Phys: [id number] [place]  1. Left ventricular ejection fraction, by estimation, is 55 to 60%. The left ventricle has normal function. The left ventricle has no regional wall motion abnormalities. Left ventricular diastolic parameters are indeterminate.  2. Right ventricular systolic function is normal. The right ventricular size is normal. There is moderately elevated pulmonary artery systolic pressure.  3. Left atrial size was moderately dilated.  4. Right atrial size was mildly dilated.  5. The mitral valve is normal in structure and function. Mild to moderate mitral valve regurgitation.  6. Tricuspid valve regurgitation is mild to moderate.  7. The aortic valve is tricuspid. Aortic valve regurgitation is not visualized. No aortic stenosis is present.  8. The inferior vena cava is normal in size with greater than 50% respiratory variability, suggesting right atrial pressure of 3 mmHg. Conclusion(s)/Recomendation(s): No intracardiac source of embolism detected on this transthoracic study. A transesophageal echocardiogram is recommended to exclude cardiac source of embolism if clinically indicated. FINDINGS  Left Ventricle: Left ventricular ejection fraction, by estimation, is 55 to 60%. The left ventricle has normal function. The left ventricle has no regional wall motion abnormalities. The left ventricular internal cavity size was normal in size. There is  no left ventricular hypertrophy. Left ventricular diastolic parameters are indeterminate. Right Ventricle: The right ventricular size is normal. No increase in right ventricular wall thickness. Right ventricular systolic function is normal. There is moderately elevated pulmonary artery systolic pressure. The tricuspid regurgitant velocity is 3.13  m/s, and with an assumed right atrial pressure of 3 mmHg, the estimated right ventricular systolic pressure is [id number] mmHg. Left Atrium: Left atrial size was moderately dilated. Right Atrium: Right atrial size was mildly dilated. Pericardium: Trivial pericardial effusion is present. Mitral Valve: The mitral valve is normal in structure and function. Mild to moderate mitral valve regurgitation. Tricuspid Valve: The tricuspid valve is normal in structure. Tricuspid valve regurgitation is mild to moderate. Aortic Valve: The aortic valve is tricuspid. Aortic valve regurgitation is not visualized. No aortic stenosis is present. Pulmonic Valve: The pulmonic valve was grossly normal. Pulmonic valve regurgitation is trivial. Aorta: The aortic root, ascending aorta and aortic arch are all structurally normal, with no evidence of dilitation or obstruction. Venous: The inferior vena cava is normal in size with greater than 50% respiratory variability, suggesting right atrial pressure of 3 mmHg. IAS/Shunts: No atrial level shunt detected by color flow Doppler.  LEFT VENTRICLE PLAX 2D LVIDd:         4.80 cm  Diastology LVIDs:         3.40 cm  LV e' lateral:   10.10 cm/s LV PW:         0.90 cm  LV E/e' lateral: 10.4 LV IVS:        0.80 cm  LV e' medial:    8.59 cm/s LVOT diam:     2.00 cm  LV E/e' medial:  12.2 LV SV:         76.34 ml LV SV Index:   32.85 LVOT Area:     3.14 cm  RIGHT VENTRICLE RV S prime:     12.50 cm/s TAPSE (M-mode): 2.7 cm LEFT ATRIUM             Index       RIGHT ATRIUM           Index LA diam:        4.50 cm 2.53 cm/m  RA Area:     18.30 cm LA Vol (A2C):   78.8 ml 44.29 ml/m RA Volume:   47.40 ml  26.64 ml/m LA Vol (A4C):   70.6 ml 39.68 ml/m LA Biplane Vol: 76.3 ml 42.88 ml/m  AORTIC VALVE LVOT Vmax:   114.00 cm/s LVOT Vmean:  78.000 cm/s LVOT VTI:    0.243 m  AORTA Ao Root diam: 2.60 cm MITRAL VALVE                TRICUSPID VALVE MV Area (PHT): 3.53 cm     TR Peak grad:   39.2 mmHg MV Decel Time:  215 msec     TR Vmax:        313.00 cm/s MV E velocity: 105.00 cm/s MV A velocity: 65.60 cm/s   SHUNTS MV E/A ratio:  1.60         Systemic VTI:  0.24 m                             Systemic Diam: 2.00 cm Buford Dresser MD Electronically signed by Buford Dresser MD Signature Date/Time: 09/04/2019/9:08:38 PM    Final      Assessment/Plan: Diagnosis: Left thalamic infarct 1. Does  the need for close, 24 hr/day medical supervision in concert with the patient's rehab needs make it unreasonable for this patient to be served in a less intensive setting? Yes 2. Co-Morbidities requiring supervision/potential complications: Hypertension, type 2 diabetes, previous history of CVA, atrial fibrillation new onset 3. Due to bladder management, bowel management, safety, skin/wound care, disease management, medication administration, pain management and patient education, does the patient require 24 hr/day rehab nursing? Yes 4. Does the patient require coordinated care of a physician, rehab nurse, therapy disciplines of PT, OT to address physical and functional deficits in the context of the above medical diagnosis(es)? Yes Addressing deficits in the following areas: balance, endurance, locomotion, strength, transferring, bowel/bladder control, bathing, dressing, toileting and psychosocial support 5. Can the patient actively participate in an intensive therapy program of at least 3 hrs of therapy per day at least 5 days per week? Yes 6. The potential for patient to make measurable gains while on inpatient rehab is excellent 7. Anticipated functional outcomes upon discharge from inpatient rehab are modified independent  with PT, modified independent with OT, n/a with SLP. 8. Estimated rehab length of stay to reach the above functional goals is: 7 to 10 days 9. Anticipated discharge destination: Home 10. Overall Rehab/Functional Prognosis: excellent  RECOMMENDATIONS: This patient's condition is  appropriate for continued rehabilitative care in the following setting: CIR Patient has agreed to participate in recommended program. Yes Note that insurance prior authorization may be required for reimbursement for recommended care.  Comment: Day 1 post admission should be ready tomorrow   Bary Leriche, PA-C 09/05/2019   "I have personally performed a face to face diagnostic evaluation of this patient.  Additionally, I have reviewed and concur with the physician assistant's documentation above." Charlett Blake M.D. [facility] FAAPM&R (Neuromuscular Med) Diplomate Am Board of Electrodiagnostic Med Fellow Am Board of Interventional Pain

## 2019-09-05 NOTE — Progress Notes (Signed)
ANTICOAGULATION CONSULT NOTE - Initial Consult  Pharmacy Consult for apixaban Indication: atrial fibrillation  Allergies  Allergen Reactions  . Lipitor [Atorvastatin]     Bloating    Patient Measurements: Height: 5\' 4"  (162.6 cm) Weight: 160 lb (72.6 kg) IBW/kg (Calculated) : 54.7  Vital Signs: Temp: 98.6 F (37 C) (02/18 0609) Temp Source: Oral (02/18 0609) BP: 129/72 (02/18 0609) Pulse Rate: 65 (02/18 0609)  Labs: Recent Labs    09/04/19 0934 09/04/19 1209 09/05/19 0247  HGB 13.2  --  11.4*  HCT 40.8  --  34.9*  PLT 242  --  200  LABPROT 13.4  --   --   INR 1.0  --   --   CREATININE 0.63  --  0.61  TROPONINIHS 4 3  --     Estimated Creatinine Clearance: 53 mL/min (by C-G formula based on SCr of 0.61 mg/dL).   Medical History: Past Medical History:  Diagnosis Date  . Arthritis   . Asthma    pt denies  . Diabetes mellitus   . Diverticulosis   . Gout   . Hypertension   . Osteoporosis   . Stroke [facility])    Assessment: 36 yof presented with an acute stroke found to be in afib. To start apixaban for new afib. Hgb is slightly low and platelets are WNL. She is not on anticoagulation PTA.   Goal of Therapy:  Stroke prevention Monitor platelets by anticoagulation protocol: Yes   Plan:  Apixaban 5mg  PO BID  F/u renal fxn, S&S of bleeding  Sonam Huelsmann, Rande Lawman 09/05/2019,3:19 PM

## 2019-09-05 NOTE — Progress Notes (Signed)
Inpatient Rehabilitation-Admissions Coordinator   Met with pt bedside as follow up from PM&R consult. Discussed recommended CIR program, expectations, expected LOS, and anticipated level of function at DC. Pt interested in the program if her insurance approves. Left pt with brochures to look over tonight and will follow up with her tomorrow. AC will begin insurance authorization process for possible admit.   Raechel Ache, OTR/L  Rehab Admissions Coordinator  [phone] 09/05/2019 3:50 PM

## 2019-09-05 NOTE — Progress Notes (Signed)
STROKE TEAM PROGRESS NOTE   INTERVAL HISTORY Her lab tech and attending MD Dr. Grandville Silos is at the bedside.  Patient up in the chair. Recounted HPI w/ Dr. Leonie Man. Pt continues to work several part-time jobs PTA.  MRI scan of the brain showed a left thalamic lacunar infarct.  Transthoracic echo is normal.  LDL cholesterol 47 mg percent and hemoglobin A1c 6.7.  EKG shows likely atrial fibrillation which apparently is a new finding  Vitals:   09/05/19 0000 09/05/19 0200 09/05/19 0400 09/05/19 0609  BP: 114/63 126/63 122/63 129/72  Pulse: 61 61 63 65  Resp: 15 18  18   Temp: 97.7 F (36.5 C) 98.6 F (37 C) 98.6 F (37 C) 98.6 F (37 C)  TempSrc: Oral Oral Oral Oral  SpO2: 96% 98% 99% 97%  Weight:      Height:        CBC:  Recent Labs  Lab 09/04/19 0934 09/05/19 0247  WBC 7.3 6.6  NEUTROABS 4.6  --   HGB 13.2 11.4*  HCT 40.8 34.9*  MCV 96.2 94.3  PLT 242 [id number]    Basic Metabolic Panel:  Recent Labs  Lab 09/04/19 0934 09/05/19 0247  NA 140 140  K 4.0 3.1*  CL 101 103  CO2 24 26  GLUCOSE 148* 152*  BUN 13 13  CREATININE 0.63 0.61  CALCIUM 9.5 9.1   Lipid Panel:     Component Value Date/Time   CHOL 134 09/05/2019 0247   TRIG 70 09/05/2019 0247   HDL 73 09/05/2019 0247   CHOLHDL 1.8 09/05/2019 0247   VLDL 14 09/05/2019 0247   LDLCALC 47 09/05/2019 0247   HgbA1c:  Lab Results  Component Value Date   HGBA1C 6.7 (H) 09/05/2019   Urine Drug Screen: No results found for: LABOPIA, COCAINSCRNUR, LABBENZ, AMPHETMU, THCU, LABBARB  Alcohol Level No results found for: ETH  IMAGING past 48 hours DG Chest 2 View  Result Date: 09/04/2019 CLINICAL DATA:  Stroke. EXAM: CHEST - 2 VIEW COMPARISON:  Single view of the chest 04/27/2017. FINDINGS: There is cardiomegaly and mild vascular congestion. Atherosclerosis. No consolidative process, pneumothorax or effusion. Scoliosis noted. IMPRESSION: Cardiomegaly and vascular congestion. Atherosclerosis. Electronically Signed   By: Inge Rise M.D.   On: 09/04/2019 14:20   MR BRAIN WO CONTRAST  Result Date: 09/04/2019 CLINICAL DATA:  Acute neuro deficit.  Right-sided numbness. EXAM: MRI HEAD WITHOUT CONTRAST TECHNIQUE: Multiplanar, multiecho pulse sequences of the brain and surrounding structures were obtained without intravenous contrast. COMPARISON:  None. FINDINGS: Brain: Acute infarct left thalamus measuring under 1 cm. No other acute infarct. Generalized atrophy. Multiple small periventricular deep white matter hyperintensities bilaterally most consistent with chronic microvascular ischemia. Negative for hemorrhage mass or fluid collection. Vascular: Normal arterial flow voids Skull and upper cervical spine: No focal skeletal lesion. Sinuses/Orbits: Mild mucosal edema paranasal sinuses. Negative orbit Other: None IMPRESSION: Subcentimeter acute infarct left thalamus. Atrophy and mild chronic microvascular ischemic change in the white matter. Electronically Signed   By: Franchot Gallo M.D.   On: 09/04/2019 11:30   ECHOCARDIOGRAM COMPLETE  Result Date: 09/04/2019    ECHOCARDIOGRAM REPORT   Patient Name:   Kelly Bates Date of Exam: 09/04/2019 Medical Rec #:  [id number]       Height:       64.0 in Accession #:    [id number]      Weight:       160.0 lb Date of Birth:  07/31/1935  BSA:          1.78 m Patient Age:    84 years        BP:           112/73 mmHg Patient Gender: F               HR:           67 bpm. Exam Location:  Inpatient Procedure: 2D Echo Indications:    stroke 434.91  History:        Patient has no prior history of Echocardiogram examinations.                 Arrythmias:Atrial Fibrillation; Risk Factors:Diabetes,                 Hypertension, Dyslipidemia and Current Smoker.  Sonographer:    Johny Chess Referring Phys: [id number] [place]  1. Left ventricular ejection fraction, by estimation, is 55 to 60%. The left ventricle has normal function. The left ventricle has no regional  wall motion abnormalities. Left ventricular diastolic parameters are indeterminate.  2. Right ventricular systolic function is normal. The right ventricular size is normal. There is moderately elevated pulmonary artery systolic pressure.  3. Left atrial size was moderately dilated.  4. Right atrial size was mildly dilated.  5. The mitral valve is normal in structure and function. Mild to moderate mitral valve regurgitation.  6. Tricuspid valve regurgitation is mild to moderate.  7. The aortic valve is tricuspid. Aortic valve regurgitation is not visualized. No aortic stenosis is present.  8. The inferior vena cava is normal in size with greater than 50% respiratory variability, suggesting right atrial pressure of 3 mmHg. Conclusion(s)/Recomendation(s): No intracardiac source of embolism detected on this transthoracic study. A transesophageal echocardiogram is recommended to exclude cardiac source of embolism if clinically indicated. FINDINGS  Left Ventricle: Left ventricular ejection fraction, by estimation, is 55 to 60%. The left ventricle has normal function. The left ventricle has no regional wall motion abnormalities. The left ventricular internal cavity size was normal in size. There is  no left ventricular hypertrophy. Left ventricular diastolic parameters are indeterminate. Right Ventricle: The right ventricular size is normal. No increase in right ventricular wall thickness. Right ventricular systolic function is normal. There is moderately elevated pulmonary artery systolic pressure. The tricuspid regurgitant velocity is 3.13 m/s, and with an assumed right atrial pressure of 3 mmHg, the estimated right ventricular systolic pressure is [id number] mmHg. Left Atrium: Left atrial size was moderately dilated. Right Atrium: Right atrial size was mildly dilated. Pericardium: Trivial pericardial effusion is present. Mitral Valve: The mitral valve is normal in structure and function. Mild to moderate mitral valve  regurgitation. Tricuspid Valve: The tricuspid valve is normal in structure. Tricuspid valve regurgitation is mild to moderate. Aortic Valve: The aortic valve is tricuspid. Aortic valve regurgitation is not visualized. No aortic stenosis is present. Pulmonic Valve: The pulmonic valve was grossly normal. Pulmonic valve regurgitation is trivial. Aorta: The aortic root, ascending aorta and aortic arch are all structurally normal, with no evidence of dilitation or obstruction. Venous: The inferior vena cava is normal in size with greater than 50% respiratory variability, suggesting right atrial pressure of 3 mmHg. IAS/Shunts: No atrial level shunt detected by color flow Doppler.  LEFT VENTRICLE PLAX 2D LVIDd:         4.80 cm  Diastology LVIDs:         3.40 cm  LV e' lateral:   10.10  cm/s LV PW:         0.90 cm  LV E/e' lateral: 10.4 LV IVS:        0.80 cm  LV e' medial:    8.59 cm/s LVOT diam:     2.00 cm  LV E/e' medial:  12.2 LV SV:         76.34 ml LV SV Index:   32.85 LVOT Area:     3.14 cm  RIGHT VENTRICLE RV S prime:     12.50 cm/s TAPSE (M-mode): 2.7 cm LEFT ATRIUM             Index       RIGHT ATRIUM           Index LA diam:        4.50 cm 2.53 cm/m  RA Area:     18.30 cm LA Vol (A2C):   78.8 ml 44.29 ml/m RA Volume:   47.40 ml  26.64 ml/m LA Vol (A4C):   70.6 ml 39.68 ml/m LA Biplane Vol: 76.3 ml 42.88 ml/m  AORTIC VALVE LVOT Vmax:   114.00 cm/s LVOT Vmean:  78.000 cm/s LVOT VTI:    0.243 m  AORTA Ao Root diam: 2.60 cm MITRAL VALVE                TRICUSPID VALVE MV Area (PHT): 3.53 cm     TR Peak grad:   39.2 mmHg MV Decel Time: 215 msec     TR Vmax:        313.00 cm/s MV E velocity: 105.00 cm/s MV A velocity: 65.60 cm/s   SHUNTS MV E/A ratio:  1.60         Systemic VTI:  0.24 m                             Systemic Diam: 2.00 cm Buford Dresser MD Electronically signed by Buford Dresser MD Signature Date/Time: 09/04/2019/9:08:38 PM    Final     PHYSICAL EXAM Pleasant elderly  African-American lady not in distress. . Afebrile. Head is nontraumatic. Neck is supple without bruit.    Cardiac exam no murmur or gallop. Lungs are clear to auscultation. Distal pulses are well felt. Neurological Exam ;  Awake  Alert oriented x 3. Normal speech and language.eye movements full without nystagmus.fundi were not visualized. Vision acuity and fields appear normal. Hearing is normal. Palatal movements are normal. Face symmetric. Tongue midline. Normal strength, tone, reflexes and coordination except for mild decreased strength in the right grip and diminished right finger tapping movements and orbits left over right upper extremity.  Mild weakness of right hip flexors..  Diminished right hemibody sensation to touch and pinprick. Gait deferred.  ASSESSMENT/PLAN Kelly Bates is a 84 y.o. female with history of HTN and DB who had sudden onset R sided numbness that progressed to R sided weakness, presenting to [facility] ED. She reports hx palpitations for several months.  Stroke:   L thalamic infarct embolic secondary unknown source, suspicious for atrial fibrillation   MRI  Subcentimeter L thalamic infarct. Small vessel disease. Atrophy.   CTA head & neck pending   Carotid Doppler  pending   2D Echo EF 55-60%. No source of embolus   LDL 47  HgbA1c 6.7  SCDs for VTE prophylaxis  aspirin 81 mg daily prior to admission, now on aspirin 325 mg daily. Change to Eliquis given +AF  Therapy recommendations:  CIR  Disposition:  pending   Palpitations, new onset atrial fibrillation   Cardiology to see and evaluate-> confirmed AF -  Ok to start Eliquis from neuro standpoint -> ordered  Hypertension  Stable . Permissive hypertension (OK if < 220/120) but gradually normalize in 5-7 days . Long-term BP goal normotensive  Hyperlipidemia  Home meds:  mevacor 40  Now on pravachol 80  LDL 47, goal < 70  Continue statin at discharge  Diabetes  type II Controlled  HgbA1c 6.7, goal < 7.0  Other Stroke Risk Factors  Advanced age  Cigarette smoker, smokes 5 cigarettes/day, advised to stop smoking  ETOH use,  advised to drink no more than 1 drink(s) a day  recommend weight loss, diet and exercise as appropriate   Family hx stroke (father)  Valvular heart disease  Other Active Problems  Hypokalemia   Hospital day # 1  I have personally obtained history,examined this patient, reviewed notes, independently viewed imaging studies, participated in medical decision making and plan of care.ROS completed by me personally and pertinent positives fully documented  I have made any additions or clarifications directly to the above note.  She has presented with left thalamic infarct from small vessel disease but EKG is suspicious for atrial fibrillation.  Recommend cardiology consult and may change to Eliquis if atrial fibrillation is confirmed otherwise continue aspirin.  Continue ongoing stroke work-up and aggressive risk factor modification.  Discussed with Dr. Irine Seal, MD.  Greater than 50% time during this 35-minute visit was spent on counseling and coordination of care about her lacunar stroke and atrial fibrillation evaluation and treatment plan discussion with patient and care team.   Antony Contras, MD Medical Director [place] Pager: [phone] 09/05/2019 5:22 PM   To contact Stroke Continuity provider, please refer to [email or web address] After hours, contact General Neurology

## 2019-09-05 NOTE — Progress Notes (Signed)
Transitions of Care Pharmacist Note  Kelly Bates is a 84 y.o. female that has been diagnosed with A Fib and will be prescribed Eliquis (apixaban) at discharge.   Patient Education: I provided the following education on Eliquis to the patient: How to take the medication Described what the medication is Signs of bleeding Signs/symptoms of VTE and stroke  Answered their questions  Discharge Medications Plan: The patient wants to have their discharge medications filled by the Transitions of Care pharmacy rather than their usual pharmacy if she has medications to be filled upon discharge from [place].   The discharge orders pharmacy has been changed to the Transitions of Care pharmacy, the patient will receive a phone call regarding co-pay, and their medications will be delivered by the Transitions of Care pharmacy.   Care management has been consulted for a benefit check.   Thank you,   Eddie Candle, PharmD PGY-1 Pharmacy Resident   Please check amion for clinical pharmacist contact number  September 05, 2019

## 2019-09-05 NOTE — Consult Note (Addendum)
Cardiology Consultation:   Patient ID: Kelly Bates MRN: [id number]; DOB: 1936-05-19  Admit date: 09/04/2019 Date of Consult: 09/05/2019  Primary Care Provider: Lujean Amel, MD Primary Cardiologist: Dr.Skains   Patient Profile:   Kelly Bates is a 84 y.o. female with a hx of hypertension, diabetes mellitus and tobacco smoking who is being seen today for the evaluation of atrial fibrillation at the request of Dr. Grandville Silos.  Patient was seen by Dr. Marlou Porch once in 2017 for chest pain which felt like atypical.  Follow-up stress test was low risk without evidence of ischemia.  As needed follow-up recommended.  History of Present Illness:   Kelly Bates sudden onset funny feeling and right arm numbness on 2/16 afternoon.  She thought this was due to stress taking care of Kelly Bates.  Later in the evening/night she had a numbness feeling on her leg and EMS was called.  No stroke call was called.  She declined to come in for further evaluation.  Yesterday 2/17 in the morning patient had right extremity weakness and numbness and came to ER for further evaluation.  MRI of brain showed acute left thalamus infract.  Undergoing stroke work-up.  Pending CT angio of head and neck.  Echocardiogram showed LV function of 55 to 60%, no wall motion abnormality, intermediate diastolic dysfunction, normal RV function, mild to moderate mitral regurgitation, mild to moderate tricuspid regurgitation.  She was also noted in rate controlled atrial fibrillation upon arrival.  Currently in sinus rhythm.  TSH normal.  Hemoglobin A1c 6.7.  Potassium 3.1.  Normal high-sensitivity troponin.  LDL 47. COVID negative.   Patient reports intermittent fluttering sensation mostly at night for the past month or so.  Denies any chest pain, shortness of breath, orthopnea, PND, syncope or lower extremity edema.  She has intermittent dizziness as well.   Heart Pathway Score:     Past Medical History:  Diagnosis Date  . Arthritis    . Asthma    pt denies  . Diabetes mellitus   . Diverticulosis   . Gout   . Hypertension   . Osteoporosis   . Stroke [facility])     Past Surgical History:  Procedure Laterality Date  . APPENDECTOMY    . COLONOSCOPY  05-2004      Inpatient Medications: Scheduled Meds: . amLODipine  2.5 mg Oral Daily  . aspirin  325 mg Oral Daily  . atenolol  100 mg Oral Daily   And  . chlorthalidone  25 mg Oral Daily  . insulin aspart  0-6 Units Subcutaneous TID WC  . pravastatin  80 mg Oral q1800   Continuous Infusions:  PRN Meds: acetaminophen, polyvinyl alcohol, traMADol  Allergies:    Allergies  Allergen Reactions  . Lipitor [Atorvastatin]     Bloating    Social History:   Social History   Socioeconomic History  . Marital status: Divorced    Spouse name: Not on file  . Number of children: Not on file  . Years of education: Not on file  . Highest education level: Not on file  Occupational History  . Not on file  Tobacco Use  . Smoking status: Current Every Day Smoker    Packs/day: 0.50    Types: Cigarettes  . Smokeless tobacco: Never Used  Substance and Sexual Activity  . Alcohol use: Yes    Alcohol/week: 14.0 standard drinks    Types: 14 Glasses of wine per week  . Drug use: No  . Sexual activity: Not Currently  Other Topics Concern  . Not on file  Social History Narrative  . Not on file   Social Determinants of Health   Financial Resource Strain:   . Difficulty of Paying Living Expenses: Not on file  Food Insecurity:   . Worried About Charity fundraiser in the Last Year: Not on file  . Ran Out of Food in the Last Year: Not on file  Transportation Needs:   . Lack of Transportation (Medical): Not on file  . Lack of Transportation (Non-Medical): Not on file  Physical Activity:   . Days of Exercise per Week: Not on file  . Minutes of Exercise per Session: Not on file  Stress:   . Feeling of Stress : Not on file  Social Connections:   . Frequency of  Communication with Friends and Family: Not on file  . Frequency of Social Gatherings with Friends and Family: Not on file  . Attends Religious Services: Not on file  . Active Member of Clubs or Organizations: Not on file  . Attends Archivist Meetings: Not on file  . Marital Status: Not on file  Intimate Partner Violence:   . Fear of Current or Ex-Partner: Not on file  . Emotionally Abused: Not on file  . Physically Abused: Not on file  . Sexually Abused: Not on file    Family History:    Family History  Problem Relation Age of Onset  . Diabetes Mother   . Hypertension Mother   . Lung cancer Father   . CVA Father   . Colon cancer Neg Hx   . Esophageal cancer Neg Hx   . Rectal cancer Neg Hx   . Stomach cancer Neg Hx      ROS:  Please see the history of present illness.  All other ROS reviewed and negative.     Physical Exam/Data:   Vitals:   09/05/19 0000 09/05/19 0200 09/05/19 0400 09/05/19 0609  BP: 114/63 126/63 122/63 129/72  Pulse: 61 61 63 65  Resp: 15 18  18   Temp: 97.7 F (36.5 C) 98.6 F (37 C) 98.6 F (37 C) 98.6 F (37 C)  TempSrc: Oral Oral Oral Oral  SpO2: 96% 98% 99% 97%  Weight:      Height:        Intake/Output Summary (Last 24 hours) at 09/05/2019 1334 Last data filed at 09/04/2019 2200 Gross per 24 hour  Intake 480 ml  Output --  Net 480 ml   Last 3 Weights 09/04/2019 04/27/2017 10/23/2015  Weight (lbs) 160 lb 170 lb 182 lb 6.4 oz  Weight (kg) 72.576 kg 77.111 kg 82.736 kg     Body mass index is 27.46 kg/m.  General:  Elderly female  HEENT: normal Lymph: no adenopathy Neck: no JVD Endocrine:  No thryomegaly Vascular: No carotid bruits; FA pulses 2+ bilaterally without bruits  Cardiac:  normal S1, S2; RRR; + murmur  Lungs:  clear to auscultation bilaterally, no wheezing, rhonchi or rales  Abd: soft, nontender, no hepatomegaly  Ext: no edema Musculoskeletal:  No deformities, right upper and lower extremity weakness Skin: warm  and dry  Neuro:  CNs 2-12 intact, no focal abnormalities noted Psych:  Normal affect   EKG:  The EKG was personally reviewed and demonstrates:   EKG 09/05/2019: sinus rhythm at rate of 67bpm EKG 09/06/19: atrial fibrillation 92bpm Telemetry:  Telemetry was personally reviewed and demonstrates: Sinus rhythm at rate of 70 bpm she was in atrial fibrillation yesterday  Relevant CV Studies:  Stress test 10/2015  Nuclear stress EF: 62%.  There was no ST segment deviation noted during stress.  The study is normal. There is no evidence of ischemia. No infarction .  This is a low risk study.  Laboratory Data:  High Sensitivity Troponin:   Recent Labs  Lab 09/04/19 0934 09/04/19 1209  TROPONINIHS 4 3     Chemistry Recent Labs  Lab 09/04/19 0934 09/05/19 0247  NA 140 140  K 4.0 3.1*  CL 101 103  CO2 24 26  GLUCOSE 148* 152*  BUN 13 13  CREATININE 0.63 0.61  CALCIUM 9.5 9.1  GFRNONAA >60 >60  GFRAA >60 >60  ANIONGAP 15 11    Recent Labs  Lab 09/04/19 0934  PROT 6.6  ALBUMIN 3.9  AST 43*  ALT 29  ALKPHOS 91  BILITOT 1.1   Hematology Recent Labs  Lab 09/04/19 0934 09/05/19 0247  WBC 7.3 6.6  RBC 4.24 3.70*  HGB 13.2 11.4*  HCT 40.8 34.9*  MCV 96.2 94.3  MCH 31.1 30.8  MCHC 32.4 32.7  RDW 13.2 13.2  PLT 242 200    Radiology/Studies:  DG Chest 2 View  Result Date: 09/04/2019 CLINICAL DATA:  Stroke. EXAM: CHEST - 2 VIEW COMPARISON:  Single view of the chest 04/27/2017. FINDINGS: There is cardiomegaly and mild vascular congestion. Atherosclerosis. No consolidative process, pneumothorax or effusion. Scoliosis noted. IMPRESSION: Cardiomegaly and vascular congestion. Atherosclerosis. Electronically Signed   By: Inge Rise M.D.   On: 09/04/2019 14:20   MR BRAIN WO CONTRAST  Result Date: 09/04/2019 CLINICAL DATA:  Acute neuro deficit.  Right-sided numbness. EXAM: MRI HEAD WITHOUT CONTRAST TECHNIQUE: Multiplanar, multiecho pulse sequences of the brain and  surrounding structures were obtained without intravenous contrast. COMPARISON:  None. FINDINGS: Brain: Acute infarct left thalamus measuring under 1 cm. No other acute infarct. Generalized atrophy. Multiple small periventricular deep white matter hyperintensities bilaterally most consistent with chronic microvascular ischemia. Negative for hemorrhage mass or fluid collection. Vascular: Normal arterial flow voids Skull and upper cervical spine: No focal skeletal lesion. Sinuses/Orbits: Mild mucosal edema paranasal sinuses. Negative orbit Other: None IMPRESSION: Subcentimeter acute infarct left thalamus. Atrophy and mild chronic microvascular ischemic change in the white matter. Electronically Signed   By: Franchot Gallo M.D.   On: 09/04/2019 11:30   ECHOCARDIOGRAM COMPLETE  Result Date: 09/04/2019    ECHOCARDIOGRAM REPORT   Patient Name:   SAMAIYA GOODWINE Date of Exam: 09/04/2019 Medical Rec #:  [id number]       Height:       64.0 in Accession #:    [id number]      Weight:       160.0 lb Date of Birth:  10-13-1935        BSA:          1.78 m Patient Age:    51 years        BP:           112/73 mmHg Patient Gender: F               HR:           67 bpm. Exam Location:  Inpatient Procedure: 2D Echo Indications:    stroke 434.91  History:        Patient has no prior history of Echocardiogram examinations.                 Arrythmias:Atrial Fibrillation; Risk Factors:Diabetes,  Hypertension, Dyslipidemia and Current Smoker.  Sonographer:    Johny Chess Referring Phys: [id number] [place]  1. Left ventricular ejection fraction, by estimation, is 55 to 60%. The left ventricle has normal function. The left ventricle has no regional wall motion abnormalities. Left ventricular diastolic parameters are indeterminate.  2. Right ventricular systolic function is normal. The right ventricular size is normal. There is moderately elevated pulmonary artery systolic pressure.  3. Left  atrial size was moderately dilated.  4. Right atrial size was mildly dilated.  5. The mitral valve is normal in structure and function. Mild to moderate mitral valve regurgitation.  6. Tricuspid valve regurgitation is mild to moderate.  7. The aortic valve is tricuspid. Aortic valve regurgitation is not visualized. No aortic stenosis is present.  8. The inferior vena cava is normal in size with greater than 50% respiratory variability, suggesting right atrial pressure of 3 mmHg. Conclusion(s)/Recomendation(s): No intracardiac source of embolism detected on this transthoracic study. A transesophageal echocardiogram is recommended to exclude cardiac source of embolism if clinically indicated. FINDINGS  Left Ventricle: Left ventricular ejection fraction, by estimation, is 55 to 60%. The left ventricle has normal function. The left ventricle has no regional wall motion abnormalities. The left ventricular internal cavity size was normal in size. There is  no left ventricular hypertrophy. Left ventricular diastolic parameters are indeterminate. Right Ventricle: The right ventricular size is normal. No increase in right ventricular wall thickness. Right ventricular systolic function is normal. There is moderately elevated pulmonary artery systolic pressure. The tricuspid regurgitant velocity is 3.13 m/s, and with an assumed right atrial pressure of 3 mmHg, the estimated right ventricular systolic pressure is [id number] mmHg. Left Atrium: Left atrial size was moderately dilated. Right Atrium: Right atrial size was mildly dilated. Pericardium: Trivial pericardial effusion is present. Mitral Valve: The mitral valve is normal in structure and function. Mild to moderate mitral valve regurgitation. Tricuspid Valve: The tricuspid valve is normal in structure. Tricuspid valve regurgitation is mild to moderate. Aortic Valve: The aortic valve is tricuspid. Aortic valve regurgitation is not visualized. No aortic stenosis is present.  Pulmonic Valve: The pulmonic valve was grossly normal. Pulmonic valve regurgitation is trivial. Aorta: The aortic root, ascending aorta and aortic arch are all structurally normal, with no evidence of dilitation or obstruction. Venous: The inferior vena cava is normal in size with greater than 50% respiratory variability, suggesting right atrial pressure of 3 mmHg. IAS/Shunts: No atrial level shunt detected by color flow Doppler.  LEFT VENTRICLE PLAX 2D LVIDd:         4.80 cm  Diastology LVIDs:         3.40 cm  LV e' lateral:   10.10 cm/s LV PW:         0.90 cm  LV E/e' lateral: 10.4 LV IVS:        0.80 cm  LV e' medial:    8.59 cm/s LVOT diam:     2.00 cm  LV E/e' medial:  12.2 LV SV:         76.34 ml LV SV Index:   32.85 LVOT Area:     3.14 cm  RIGHT VENTRICLE RV S prime:     12.50 cm/s TAPSE (M-mode): 2.7 cm LEFT ATRIUM             Index       RIGHT ATRIUM           Index LA diam:  4.50 cm 2.53 cm/m  RA Area:     18.30 cm LA Vol (A2C):   78.8 ml 44.29 ml/m RA Volume:   47.40 ml  26.64 ml/m LA Vol (A4C):   70.6 ml 39.68 ml/m LA Biplane Vol: 76.3 ml 42.88 ml/m  AORTIC VALVE LVOT Vmax:   114.00 cm/s LVOT Vmean:  78.000 cm/s LVOT VTI:    0.243 m  AORTA Ao Root diam: 2.60 cm MITRAL VALVE                TRICUSPID VALVE MV Area (PHT): 3.53 cm     TR Peak grad:   39.2 mmHg MV Decel Time: 215 msec     TR Vmax:        313.00 cm/s MV E velocity: 105.00 cm/s MV A velocity: 65.60 cm/s   SHUNTS MV E/A ratio:  1.60         Systemic VTI:  0.24 m                             Systemic Diam: 2.00 cm Buford Dresser MD Electronically signed by Buford Dresser MD Signature Date/Time: 09/04/2019/9:08:38 PM    Final     Assessment and Plan:   1. Rate controlled atrial fibrillation -New onset.  Patient reports intermittent fluttering sensation especially at night for past 3 to 4 weeks.  Intermittent dizziness.  She was in atrial fibrillation at controlled rate on arrival.  Eventually converted.  She is on  atenolol 100 mg daily>> we will continue. - CHA2DS2-VASc 2 score of at least 7 for age, sex, hypertension, hyperlipidemia, stroke and diabetes -Patient will need long-term anticoagulation. -Start Eliquis when okay with neurology. -Echocardiogram showed preserved LV function -Follow-up in A. fib clinic or with Dr. Marlou Porch  2.  Left thalamic infract -Pending CT angio of head/neck - Neurology following   3.  Valvular heart disease -Echocardiogram showed mild to moderate mitral regurgitation and mild to moderate tricuspid regurgitation -Follow-up with routine echocardiogram  4.  Hypertension -Blood pressure stable on current medication  5.  Hypokalemia -Potassium 3.1 -Supplemented   For questions or updates, please contact [place] Please consult [email or web address] for contact info under     Jarrett Soho, [place]  09/05/2019 1:34 PM   Attending Note:   The patient was seen and examined.  Agree with assessment and plan as noted above.  Changes made to the above note as needed.  Patient seen and independently examined with  Robbie Lis, PA .   We discussed all aspects of the encounter. I agree with the assessment and plan as stated above.  1.   PAF:  Pt presents with a cva and was found to have PAF.   She has converted back to NSR. She reports having episodes of palpitations that would wake her up at night for the past several months. ' These were likely Afib  I suspect this is the cause of her CVA.  HR is well controlled.   She does need to be started on DOAC .   Will defer the timing of initiation of anticoagulation to the stroke team .   2. HTN:   Well controlled.   3.  MR :  Stable   Will follow up with Dr. Marlou Porch.    [facility] will sign off.   Medication Recommendations:   Add Eliquis 5 mg PoBID when ok with medicine team or stroke team  Other recommendations (labs,  testing, etc):   Follow up as an outpatient:  With Dr. Marlou Porch or APP in a month or so       I have spent a total of 40 minutes with patient reviewing hospital  notes , telemetry, EKGs, labs and examining patient as well as establishing an assessment and plan that was discussed with the patient. > 50% of time was spent in direct patient care.   Thayer Headings, Brooke Bonito., MD, [facility] 09/05/2019, 3:18 PM [address],  [place] Pager [phone]

## 2019-09-05 NOTE — Discharge Instructions (Signed)

## 2019-09-05 NOTE — Evaluation (Signed)
Occupational Therapy Evaluation Patient Details Name: Kelly Bates MRN: [id number] DOB: [birth year] Today's Date: 09/05/2019    History of Present Illness Pt is an 84 y.o. female admitted 09/04/19 with headache and R-side numbness; pt had initially called EMS but declined transfer to hospital, pt came to ED after symptoms worsened. MRI showed subcentimeter L thalamic infarct. Also noted to have afib. PMH includes HTN, DM, HLD.   Clinical Impression   PTA patient independent and driving. Admitted for above and limited by problem list below, including impaired balance, R sided weakness, ataxia, and decreased sensation, decreased awareness to R UE, decreased activity tolerance and decreased safety awareness. Patient completing transfers with min assist given cueing for hand placement and sequencing, min assist for grooming at sink (mod assist at times for balance with 0 hand support), min-mod assist for UB/LB ADLs.  Patient demonstrates fair awareness initially to R UE and safety, but with fatigue patient becomes impulsive, requires mod cueing to attend to R UE and requires increased assist physically to maintain balance. Functional mobility in room with min assist to mod assist with cueing for walker management with L drift due to R inattention.  Patient will benefit from continued OT services while admitted and after dc at CIR level to optimize independence and safety with ADLs, mobility in order to return to modified independent level.     Follow Up Recommendations  CIR;Supervision/Assistance - 24 hour    Equipment Recommendations  3 in 1 bedside commode    Recommendations for Other Services Rehab consult     Precautions / Restrictions Precautions Precautions: Fall Restrictions Weight Bearing Restrictions: No      Mobility Bed Mobility Overal bed mobility: Needs Assistance Bed Mobility: Supine to Sit     Supine to sit: Supervision     General bed mobility comments: OOB upon entry    Transfers Overall transfer level: Needs assistance Equipment used: Rolling walker (2 wheeled) Transfers: Sit to/from Stand Sit to Stand: Min assist         General transfer comment: min assist from commode with cueing for hand placement and sequencing     Balance Overall balance assessment: Needs assistance Sitting-balance support: No upper extremity supported;Feet supported Sitting balance-Leahy Scale: Fair     Standing balance support: Bilateral upper extremity supported;No upper extremity supported;During functional activity Standing balance-Leahy Scale: Poor Standing balance comment: reliant on UE and external support dynamically, able to stand statically without UE support during grooming but with fatigue fades to needing mod assist with R lateral lean to maintain balance                            ADL either performed or assessed with clinical judgement   ADL Overall ADL's : Needs assistance/impaired     Grooming: Minimal assistance;Standing Grooming Details (indicate cue type and reason): with fatigue requires mod assist to maintain balance during grooming tasks; min assist for mgmt of toothpaste and coordination with RUE  Upper Body Bathing: Minimal assistance;Sitting   Lower Body Bathing: Minimal assistance;Sit to/from stand   Upper Body Dressing : Minimal assistance;Sitting   Lower Body Dressing: Moderate assistance;Sit to/from stand   Toilet Transfer: Minimal assistance;Moderate assistance;Ambulation;RW;Regular Toilet;Grab bars   Toileting- Clothing Manipulation and Hygiene: Minimal assistance;Sit to/from stand       Functional mobility during ADLs: Minimal assistance;Moderate assistance;Rolling walker;Cueing for sequencing;Cueing for safety General ADL Comments: pt limited by decreased awareness, weakness and coordination/sensation to RUE, impaired balance,  decreased activity tolerance and decreased safety      Vision Patient Visual Report:  No change from baseline Additional Comments: continue assessment     Perception     Praxis      Pertinent Vitals/Pain Pain Assessment: No/denies pain     Hand Dominance Right   Extremity/Trunk Assessment Upper Extremity Assessment Upper Extremity Assessment: RUE deficits/detail RUE Deficits / Details: grossly 3/5 MMT, slight drift, ataxic and decreased sensation  RUE Sensation: decreased light touch RUE Coordination: decreased gross motor;decreased fine motor   Lower Extremity Assessment Lower Extremity Assessment: Defer to PT evaluation RLE Deficits / Details: Decreased light touch throughout RLE; hip flex 4/5, knee ext 4/5, flex 3/5, ankle DF/PF 4/5 RLE Sensation: decreased light touch;decreased proprioception LLE Deficits / Details: L hip flex 3/5, knee flex/ext 4/5, ankle 4/5       Communication Communication Communication: No difficulties   Cognition Arousal/Alertness: Awake/alert Behavior During Therapy: WFL for tasks assessed/performed Overall Cognitive Status: No family/caregiver present to determine baseline cognitive functioning Area of Impairment: Attention;Safety/judgement;Awareness;Problem solving                   Current Attention Level: Selective     Safety/Judgement: Decreased awareness of deficits;Decreased awareness of safety Awareness: Emergent Problem Solving: Requires verbal cues General Comments: decreased safety awareness and slightly impulsive with fatigue, requires cueing to redirect and attend to R side    General Comments  VSS    Exercises     Shoulder Instructions      Home Living Family/patient expects to be discharged to:: Private residence Living Arrangements: Children Available Help at Discharge: Family;Available PRN/intermittently Type of Home: House Home Access: Stairs to enter [employer] of Steps: 3-4 Entrance Stairs-Rails: Right Home Layout: One level     Bathroom Shower/Tub: Animal nutritionist: Standard     Home Equipment: None   Additional Comments: Lives with son who works during day. Reports grandson in school (or currently is?) to be PTA      Prior Functioning/Environment Level of Independence: Independent        Comments: Independent; reports never needing DME; independent IADls and driving         OT Problem List: Decreased strength;Decreased activity tolerance;Impaired balance (sitting and/or standing);Decreased coordination;Decreased safety awareness;Decreased knowledge of use of DME or AE;Decreased knowledge of precautions;Impaired UE functional use      OT Treatment/Interventions: Self-care/ADL training;Energy conservation;DME and/or AE instruction;Neuromuscular education;Therapeutic activities;Patient/family education;Balance training    OT Goals(Current goals can be found in the care plan section) Acute Rehab OT Goals Patient Stated Goal: Hopeful for return home but understands need for post-acute rehab OT Goal Formulation: With patient Time For Goal Achievement: 09/19/19 Potential to Achieve Goals: Good  OT Frequency: Min 2X/week   Barriers to D/C:            Co-evaluation              AM-PAC OT "6 Clicks" Daily Activity     Outcome Measure Help from another person eating meals?: A Little Help from another person taking care of personal grooming?: A Little Help from another person toileting, which includes using toliet, bedpan, or urinal?: A Lot Help from another person bathing (including washing, rinsing, drying)?: A Lot Help from another person to put on and taking off regular upper body clothing?: A Little Help from another person to put on and taking off regular lower body clothing?: A Lot 6 Click Score: 15   End  of Session Equipment Utilized During Treatment: Gait belt;Rolling walker Nurse Communication: Mobility status;Precautions  Activity Tolerance: Patient tolerated treatment well Patient left: in chair;with  call bell/phone within reach;with chair alarm set  OT Visit Diagnosis: Other abnormalities of gait and mobility (R26.89);Muscle weakness (generalized) (M62.81)                Time: [id number] OT Time Calculation (min): 20 min Charges:  OT General Charges $OT Visit: 1 Visit OT Evaluation $OT Eval Moderate Complexity: 1 Mod  Jolaine Artist, OT Acute Rehabilitation Services Pager [phone] Office [phone]    Delight Stare 09/05/2019, 12:53 PM

## 2019-09-05 NOTE — Progress Notes (Signed)
Carotid artery duplex exam completed.  Preliminary results can be found under CV proc under chart review.  09/05/2019 4:16 PM  Raistlin Gum, K., RDMS, RVT

## 2019-09-05 NOTE — Progress Notes (Addendum)
PROGRESS NOTE    Kelly Bates  RWE:[id number] DOB: [birth year] DOA: 09/04/2019 PCP: Lujean Amel, MD    Brief Narrative:  HPI per Dr. Kary Kos Kelly Bates is an 84 y.o. 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 She attributed this to the stress associated with disciplining him.  She subsequently developed numbness in the entire right side of her body which she states goes from the top of her head all the way down to her feet.  She called EMS last night who checked her blood pressure and said that she was fine but offered to bring her to the hospital but she declined.  This morning she woke up and she said of the numbness was more dense and very bothersome to her so she came to the ED.  Patient notes she has been having palpitations and a sense of irregular heartbeat for several months now.  She was referred to a cardiologist who did what sounds like an echocardiogram which apparently was normal.  Does not sound like she ever had a loop or ambulatory EKG monitor.  At present patient continues to have headache as well as the numbness that she had before.  Denies any difficulty with gait or balance as far she is aware.  Denies dysarthria or difficulty swallowing.  No difficulty with comprehension or expression.  She denies any fevers chills malaise or any acute intercurrent illness.  No known exposure to Covid.  ED Course:  The patient was noted to have atrial fibrillation on routine EKG here.  MRI shows a subcentimeter left thalamic infarct.  Assessment & Plan:   Principal Problem:   Stroke (cerebrum) ([place]) Active Problems:   Atrial fibrillation, new onset ([place])   Hyperlipidemia   Essential hypertension   Tobacco use   Diabetes mellitus without  complication ([place])  1 acute left thalamic CVA Questionable etiology.  Concern for ischemic CVA versus secondary to new onset atrial fibrillation/atrial flutter.  EKG done on admission noted that patient in atrial flutter however patient with no prior history of atrial flutter.  Patient had presented 24 hours out of onset of symptoms and as such deemed not a TPA candidate.  MRI brain done consistent with a left thalamic infarct.  CT angiogram head and neck done this morning and pending.  2D echo done with normal EF and dilated left atrium.  No source of emboli noted on 2D echo.  LDL of 47.  Continue statin.  Patient currently on aspirin 325 mg daily may need to be on anticoagulation due to recent CVA however will defer to neurology.  PT/OT/ST.  Neurology following and appreciate input and recommendations.  2.  New onset atrial fibrillation/atrial flutter CHA2DS2 VASC SCORE 6 Noted on EKG on presentation.  Patient noted to be rate controlled and noted to be on atenolol at home.  TSH 1.781.  Patient with likely new onset atrial fib a flutter with an acute left thalamic CVA.  May need to be on anticoagulation.  2D echo done within normal EF and dilated left atrium with no source of emboli noted.  Patient denies any history of GI bleed no history of falls.  Consult with cardiology for further evaluation and management.  We will hold off on placing on anticoagulation until patient is seen  by cardiology.  3.  Hypertension Stable.  Continue atenolol/chlorthalidone and Norvasc.  4.  Well-controlled diabetes mellitus type 2 Hemoglobin A1c 6.7.  CBG of 134 this morning.  Continue to hold oral hypoglycemic agents.  Sliding scale insulin.  5.  Hyperlipidemia Well-controlled.  LDL of 47.  Continue statin.   DVT prophylaxis: SCDs Code Status: Full Family Communication: Updated patient.  No family at bedside. Disposition Plan:  . Patient came from: Home            . Anticipated d/c place: CIR versus home with  home health. . Barriers to d/c OR conditions which need to be met to effect a safe d/c: Discharge to CIR versus home with home health versus SNF when clinically improved, cleared by consultants.   Consultants:   Neurology: Dr.Arora 09/04/2019  Cardiology pending  Procedures:  CT angiogram head and neck pending 09/05/2019  Chest x-ray 09/04/2019  MRI brain 09/04/2019  2D echo 09/04/2019  Antimicrobials:   None   Subjective: Patient states feeling better since admission.  Patient does endorse some improvement with right-sided strength and numbness.  Denies any chest pain or shortness of breath.  Patient denies any prior history of bleeding or falls.  Objective: Vitals:   09/05/19 0000 09/05/19 0200 09/05/19 0400 09/05/19 0609  BP: 114/63 126/63 122/63 129/72  Pulse: 61 61 63 65  Resp: 15 18  18   Temp: 97.7 F (36.5 C) 98.6 F (37 C) 98.6 F (37 C) 98.6 F (37 C)  TempSrc: Oral Oral Oral Oral  SpO2: 96% 98% 99% 97%  Weight:      Height:        Intake/Output Summary (Last 24 hours) at 09/05/2019 1426 Last data filed at 09/04/2019 2200 Gross per 24 hour  Intake 480 ml  Output -  Net 480 ml   Filed Weights   09/04/19 0926  Weight: 72.6 kg    Examination:  General exam: Appears calm and comfortable  Respiratory system: Clear to auscultation. Respiratory effort normal. Cardiovascular system: Regular rate rhythm no murmurs rubs or gallops.  No JVD.  No lower extremity edema.   Gastrointestinal system: Abdomen is nondistended, soft and nontender. No organomegaly or masses felt. Normal bowel sounds heard. Central nervous system: Alert and oriented. No focal neurological deficits. Extremities: 4/5 right upper and right lower extremity strength.  5/5 left upper and left lower extremity strength. Skin: No rashes, lesions or ulcers Psychiatry: Judgement and insight appear normal. Mood & affect appropriate.     Data Reviewed: I have personally reviewed following labs  and imaging studies  CBC: Recent Labs  Lab 09/04/19 0934 09/05/19 0247  WBC 7.3 6.6  NEUTROABS 4.6  --   HGB 13.2 11.4*  HCT 40.8 34.9*  MCV 96.2 94.3  PLT 242 142   Basic Metabolic Panel: Recent Labs  Lab 09/04/19 0934 09/05/19 0247 09/05/19 1141  NA 140 140  --   K 4.0 3.1*  --   CL 101 103  --   CO2 24 26  --   GLUCOSE 148* 152*  --   BUN 13 13  --   CREATININE 0.63 0.61  --   CALCIUM 9.5 9.1  --   MG  --   --  1.9   GFR: Estimated Creatinine Clearance: 53 mL/min (by C-G formula based on SCr of 0.61 mg/dL). Liver Function Tests: Recent Labs  Lab 09/04/19 0934  AST 43*  ALT 29  ALKPHOS 91  BILITOT 1.1  PROT 6.6  ALBUMIN 3.9   No results for input(s): LIPASE, AMYLASE in the last 168 hours. No results for input(s): AMMONIA in the last 168 hours. Coagulation Profile: Recent Labs  Lab 09/04/19 0934  INR 1.0   Cardiac Enzymes: No results for input(s): CKTOTAL, CKMB, CKMBINDEX, TROPONINI in the last 168 hours. BNP (last 3 results) No results for input(s): PROBNP in the last 8760 hours. HbA1C: Recent Labs    09/04/19 0934 09/05/19 0247  HGBA1C 6.8* 6.7*   CBG: Recent Labs  Lab 09/04/19 0931 09/04/19 1719 09/04/19 2136 09/05/19 0621 09/05/19 1045  GLUCAP 149* 139* 111* 134* 135*   Lipid Profile: Recent Labs    09/05/19 0247  CHOL 134  HDL 73  LDLCALC 47  TRIG 70  CHOLHDL 1.8   Thyroid Function Tests: Recent Labs    09/05/19 1141  TSH 1.781   Anemia Panel: No results for input(s): VITAMINB12, FOLATE, FERRITIN, TIBC, IRON, RETICCTPCT in the last 72 hours. Sepsis Labs: No results for input(s): PROCALCITON, LATICACIDVEN in the last 168 hours.  Recent Results (from the past 240 hour(s))  SARS CORONAVIRUS 2 (TAT 6-24 HRS) Nasopharyngeal Nasopharyngeal Swab     Status: None   Collection Time: 09/04/19 12:13 PM   Specimen: Nasopharyngeal Swab  Result Value Ref Range Status   SARS Coronavirus 2 NEGATIVE NEGATIVE Final    Comment:  (NOTE) SARS-CoV-2 target nucleic acids are NOT DETECTED. The SARS-CoV-2 RNA is generally detectable in upper and lower respiratory specimens during the acute phase of infection. Negative results do not preclude SARS-CoV-2 infection, do not rule out co-infections with other pathogens, and should not be used as the sole basis for treatment or other patient management decisions. Negative results must be combined with clinical observations, patient history, and epidemiological information. The expected result is Negative. Fact Sheet for Patients: SugarRoll.be Fact Sheet for Healthcare Providers: https://www.woods-mathews.com/ This test is not yet approved or cleared by the [place] FDA and  has been authorized for detection and/or diagnosis of SARS-CoV-2 by FDA under an Emergency Use Authorization (EUA). This EUA will remain  in effect (meaning this test can be used) for the duration of the COVID-19 declaration under Section 56 4(b)(1) of the Act, 21 U.S.C. section 360bbb-3(b)(1), unless the authorization is terminated or revoked sooner. Performed at [facility], [place] [address] [place], [place] [address]          Radiology Studies: DG Chest 2 View  Result Date: 09/04/2019 CLINICAL DATA:  Stroke. EXAM: CHEST - 2 VIEW COMPARISON:  Single view of the chest 04/27/2017. FINDINGS: There is cardiomegaly and mild vascular congestion. Atherosclerosis. No consolidative process, pneumothorax or effusion. Scoliosis noted. IMPRESSION: Cardiomegaly and vascular congestion. Atherosclerosis. Electronically Signed   By: Inge Rise M.D.   On: 09/04/2019 14:20   MR BRAIN WO CONTRAST  Result Date: 09/04/2019 CLINICAL DATA:  Acute neuro deficit.  Right-sided numbness. EXAM: MRI HEAD WITHOUT CONTRAST TECHNIQUE: Multiplanar, multiecho pulse sequences of the brain and surrounding structures were obtained without intravenous contrast. COMPARISON:   None. FINDINGS: Brain: Acute infarct left thalamus measuring under 1 cm. No other acute infarct. Generalized atrophy. Multiple small periventricular deep white matter hyperintensities bilaterally most consistent with chronic microvascular ischemia. Negative for hemorrhage mass or fluid collection. Vascular: Normal arterial flow voids Skull and upper cervical spine: No focal skeletal lesion. Sinuses/Orbits: Mild mucosal edema paranasal sinuses. Negative orbit Other: None IMPRESSION: Subcentimeter acute infarct left thalamus. Atrophy and mild chronic microvascular ischemic change in the white matter. Electronically Signed  By: Franchot Gallo M.D.   On: 09/04/2019 11:30   ECHOCARDIOGRAM COMPLETE  Result Date: 09/04/2019    ECHOCARDIOGRAM REPORT   Patient Name:   Kelly Bates Date of Exam: 09/04/2019 Medical Rec #:  [id number]       Height:       64.0 in Accession #:    [id number]      Weight:       160.0 lb Date of Birth:  1935/11/21        BSA:          1.78 m Patient Age:    60 years        BP:           112/73 mmHg Patient Gender: F               HR:           67 bpm. Exam Location:  Inpatient Procedure: 2D Echo Indications:    stroke 434.91  History:        Patient has no prior history of Echocardiogram examinations.                 Arrythmias:Atrial Fibrillation; Risk Factors:Diabetes,                 Hypertension, Dyslipidemia and Current Smoker.  Sonographer:    Johny Chess Referring Phys: [id number] [place]  1. Left ventricular ejection fraction, by estimation, is 55 to 60%. The left ventricle has normal function. The left ventricle has no regional wall motion abnormalities. Left ventricular diastolic parameters are indeterminate.  2. Right ventricular systolic function is normal. The right ventricular size is normal. There is moderately elevated pulmonary artery systolic pressure.  3. Left atrial size was moderately dilated.  4. Right atrial size was mildly dilated.  5. The  mitral valve is normal in structure and function. Mild to moderate mitral valve regurgitation.  6. Tricuspid valve regurgitation is mild to moderate.  7. The aortic valve is tricuspid. Aortic valve regurgitation is not visualized. No aortic stenosis is present.  8. The inferior vena cava is normal in size with greater than 50% respiratory variability, suggesting right atrial pressure of 3 mmHg. Conclusion(s)/Recomendation(s): No intracardiac source of embolism detected on this transthoracic study. A transesophageal echocardiogram is recommended to exclude cardiac source of embolism if clinically indicated. FINDINGS  Left Ventricle: Left ventricular ejection fraction, by estimation, is 55 to 60%. The left ventricle has normal function. The left ventricle has no regional wall motion abnormalities. The left ventricular internal cavity size was normal in size. There is  no left ventricular hypertrophy. Left ventricular diastolic parameters are indeterminate. Right Ventricle: The right ventricular size is normal. No increase in right ventricular wall thickness. Right ventricular systolic function is normal. There is moderately elevated pulmonary artery systolic pressure. The tricuspid regurgitant velocity is 3.13 m/s, and with an assumed right atrial pressure of 3 mmHg, the estimated right ventricular systolic pressure is 74.8 mmHg. Left Atrium: Left atrial size was moderately dilated. Right Atrium: Right atrial size was mildly dilated. Pericardium: Trivial pericardial effusion is present. Mitral Valve: The mitral valve is normal in structure and function. Mild to moderate mitral valve regurgitation. Tricuspid Valve: The tricuspid valve is normal in structure. Tricuspid valve regurgitation is mild to moderate. Aortic Valve: The aortic valve is tricuspid. Aortic valve regurgitation is not visualized. No aortic stenosis is present. Pulmonic Valve: The pulmonic valve was grossly normal. Pulmonic valve regurgitation is  trivial.  Aorta: The aortic root, ascending aorta and aortic arch are all structurally normal, with no evidence of dilitation or obstruction. Venous: The inferior vena cava is normal in size with greater than 50% respiratory variability, suggesting right atrial pressure of 3 mmHg. IAS/Shunts: No atrial level shunt detected by color flow Doppler.  LEFT VENTRICLE PLAX 2D LVIDd:         4.80 cm  Diastology LVIDs:         3.40 cm  LV e' lateral:   10.10 cm/s LV PW:         0.90 cm  LV E/e' lateral: 10.4 LV IVS:        0.80 cm  LV e' medial:    8.59 cm/s LVOT diam:     2.00 cm  LV E/e' medial:  12.2 LV SV:         76.34 ml LV SV Index:   32.85 LVOT Area:     3.14 cm  RIGHT VENTRICLE RV S prime:     12.50 cm/s TAPSE (M-mode): 2.7 cm LEFT ATRIUM             Index       RIGHT ATRIUM           Index LA diam:        4.50 cm 2.53 cm/m  RA Area:     18.30 cm LA Vol (A2C):   78.8 ml 44.29 ml/m RA Volume:   47.40 ml  26.64 ml/m LA Vol (A4C):   70.6 ml 39.68 ml/m LA Biplane Vol: 76.3 ml 42.88 ml/m  AORTIC VALVE LVOT Vmax:   114.00 cm/s LVOT Vmean:  78.000 cm/s LVOT VTI:    0.243 m  AORTA Ao Root diam: 2.60 cm MITRAL VALVE                TRICUSPID VALVE MV Area (PHT): 3.53 cm     TR Peak grad:   39.2 mmHg MV Decel Time: 215 msec     TR Vmax:        313.00 cm/s MV E velocity: 105.00 cm/s MV A velocity: 65.60 cm/s   SHUNTS MV E/A ratio:  1.60         Systemic VTI:  0.24 m                             Systemic Diam: 2.00 cm Buford Dresser MD Electronically signed by Buford Dresser MD Signature Date/Time: 09/04/2019/9:08:38 PM    Final         Scheduled Meds: . amLODipine  2.5 mg Oral Daily  . aspirin  325 mg Oral Daily  . atenolol  100 mg Oral Daily   And  . chlorthalidone  25 mg Oral Daily  . insulin aspart  0-6 Units Subcutaneous TID WC  . pravastatin  80 mg Oral q1800   Continuous Infusions:   LOS: 1 day    Time spent: 40 minutes    Irine Seal, MD [facility]   To contact  the attending provider between 7A-7P or the covering provider during after hours 7P-7A, please log into the web site [email or web address] and access using universal [place] password for that web site. If you do not have the password, please call the hospital operator.  09/05/2019, 2:26 PM

## 2019-09-06 ENCOUNTER — Inpatient Hospital Stay (HOSPITAL_COMMUNITY): Payer: Medicare PPO

## 2019-09-06 DIAGNOSIS — E042 Nontoxic multinodular goiter: Secondary | ICD-10-CM

## 2019-09-06 LAB — GLUCOSE, CAPILLARY
Glucose-Capillary: 131 mg/dL — ABNORMAL HIGH (ref 70–99)
Glucose-Capillary: 122 mg/dL — ABNORMAL HIGH (ref 70–99)
Glucose-Capillary: 146 mg/dL — ABNORMAL HIGH (ref 70–99)

## 2019-09-06 LAB — BASIC METABOLIC PANEL
Anion gap: 9 (ref 5–15)
BUN: 6 mg/dL — ABNORMAL LOW (ref 8–23)
CO2: 28 mmol/L (ref 22–32)
Calcium: 9.2 mg/dL (ref 8.9–10.3)
Chloride: 104 mmol/L (ref 98–111)
Creatinine, Ser: 0.54 mg/dL (ref 0.44–1.00)
GFR calc Af Amer: >60 mL/min (ref >60)
GFR calc non Af Amer: >60 mL/min (ref >60)
Glucose, Bld: 139 mg/dL — ABNORMAL HIGH (ref 70–99)
Potassium: 3.4 mmol/L — ABNORMAL LOW (ref 3.5–5.1)
Sodium: 141 mmol/L (ref 135–145)

## 2019-09-06 LAB — CBC
HCT: 34.9 % — ABNORMAL LOW (ref 36.0–46.0)
Hemoglobin: 11.5 g/dL — ABNORMAL LOW (ref 12.0–15.0)
MCH: 30.9 pg (ref 26.0–34.0)
MCHC: 33 g/dL (ref 30.0–36.0)
MCV: 93.8 fL (ref 80.0–100.0)
Platelets: 187 10*3/uL (ref 150–400)
RBC: 3.72 MIL/uL — ABNORMAL LOW (ref 3.87–5.11)
RDW: 13.2 % (ref 11.5–15.5)
WBC: 5.6 10*3/uL (ref 4.0–10.5)
nRBC: 0 % (ref 0.0–0.2)

## 2019-09-06 MED ORDER — POTASSIUM CHLORIDE CRYS ER 20 MEQ PO TBCR
40.0000 meq | EXTENDED_RELEASE_TABLET | Freq: Once | ORAL | Status: AC
  Administered 2019-09-06: 40 meq via ORAL
  Filled 2019-09-06: qty 2

## 2019-09-06 MED ORDER — APIXABAN 5 MG PO TABS: 5.0000 mg | ORAL_TABLET | Freq: Two times a day (BID) | ORAL | 1 refills | Status: DC

## 2019-09-06 MED FILL — ELIQUIS 5 MG TABLET: 5 | 30 days supply | Qty: 60 | Fill #0

## 2019-09-06 NOTE — Progress Notes (Addendum)
Inpatient Rehabilitation-Admissions Coordinator  Dr. Grandville Silos completed peer to peer conference. Pt has been issued a final denial despite completion of peer to peer.   Discussed current insurance situation with pt at the bedside. She states she feels she is able to manage at home as she is feeling better and more steady. Per PT, pt has demonstrated functional improvement and they have updated their recommendation to [facility]. Pt prefers to go home instead of pursing CIR at this time. AC will sign off. Dr. Grandville Silos aware. I will notify TOC team.   Raechel Ache, OTR/L  Rehab Admissions Coordinator  [phone] 09/06/2019 2:42 PM

## 2019-09-06 NOTE — Progress Notes (Signed)
Pt's son here, pt d/c'd via wheelchair with belongings and equipment. Escorted by unit staff.

## 2019-09-06 NOTE — Discharge Summary (Signed)
Physician Discharge Summary  Kelly Bates [id number] DOB: 27-Oct-1935 DOA: 09/04/2019  PCP: Lujean Amel, MD  Admit date: 09/04/2019 Discharge date: 09/06/2019  Time spent: 55 minutes  Recommendations for Outpatient Follow-up:  1. Follow-up with Koirala, Dibas, MD in 2 weeks.  On follow-up patient will need a basic metabolic profile done to follow-up on electrolytes and renal function.  Thyroid ultrasound done during the hospitalization will need to be followed up upon as patient noted to have multinodular thyroid during this hospitalization and results were pending by time of discharge.  TSH done was within normal limits. 2. Follow-up with Dr. Leonie Man, neurology in 4 -6 weeks. 3. Follow-up with Dr. Marlou Porch, cardiology in 2 weeks.   Discharge Diagnoses:  Principal Problem:   Stroke (cerebrum) ([place]) Active Problems:   Atrial fibrillation, new onset ([place])   Hyperlipidemia   Essential hypertension   Tobacco use   Diabetes mellitus without complication ([place])   Discharge Condition: Stable and improved  Diet recommendation: Heart healthy  Filed Weights   09/04/19 0926  Weight: 72.6 kg    History of present illness:  HPI per Dr. Birdena Jubilee is an 84 y.o. 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 She attributed this to the stress associated with disciplining him.  She subsequently developed numbness in the entire right side of her body which she stated goes from the top of her head all the way down to her feet.  She called EMS last night who checked her blood pressure and said that she was fine but offered to bring her to the hospital but she declined.  This morning she woke up and she said of the numbness was more dense  and very bothersome to her so she came to the ED.  Patient noted she had been having palpitations and a sense of irregular heartbeat for several months now.  She was referred to a cardiologist who did what sounds like an echocardiogram which apparently was normal.  Does not sound like she ever had a loop or ambulatory EKG monitor.  At present patient continues to have headache as well as the numbness that she had before.  Denies any difficulty with gait or balance as far she is aware.  Denies dysarthria or difficulty swallowing.  No difficulty with comprehension or expression.  She denies any fevers chills malaise or any acute intercurrent illness.  No known exposure to Covid.  ED Course:  The patient was noted to have atrial fibrillation on routine EKG here.  MRI shows a subcentimeter left thalamic infarct  Hospital Course:  1 acute left thalamic CVA likely embolic Questionable etiology.  Concern for ischemic CVA versus secondary to new onset atrial fibrillation/atrial flutter.  EKG done on admission noted that patient in atrial flutter however patient with no prior history of atrial flutter.  Patient had presented 24 hours out of onset of symptoms and as such deemed not a TPA candidate.  MRI brain done consistent with a left thalamic infarct.  CT angiogram head and neck done with attenuation of left PCA branches at the P2 A/P2 P junction may represent atherosclerotic disease versus subocclusive clot.  No hemodynamically significant stenosis of the carotid arteries in the neck.  Hypodense focus in the left  thalamus corresponds to acute infarct described on prior MRI.  Enlarged multinodular thyroid gland.  Carotid Dopplers which were done were negative for any significant ICA stenosis.  2D echo done with normal EF and dilated left atrium.  No source of emboli noted on 2D echo.  LDL of 47.    Patient maintained on home regimen statin.  Patient was initially placed on aspirin 325 mg daily however after  being evaluated by cardiology and noted to have paroxysmal new onset A. fib it was recommended to place patient on anticoagulation and patient was subsequently started on Eliquis 5 mg twice daily.  Aspirin was subsequently discontinued.  Patient was seen by PT/OT/ST.  Patient will be discharged home with home health therapies in stable and improved condition.  Outpatient follow-up with neurology in 4 to 6 weeks.   2.  New onset paroxysmal atrial fibrillation/atrial flutter CHA2DS2 VASC SCORE AT least 7 Noted on EKG on presentation.  Patient noted to be rate controlled and noted to be on atenolol at home.  TSH 1.781.  Patient with likely new onset atrial fib a flutter with an acute left thalamic CVA. 2D echo done within normal EF and dilated left atrium with no source of emboli noted.  Patient denied any history of GI bleed no history of falls.    Cardiology was consulted and was felt patient was having paroxysmal atrial fibrillation.  Patient had converted back to normal sinus rhythm.  Patient had reported to cardiology-has been having episodes of palpitations that would wake her up at night for the past several months felt likely to be A. fib.  It was felt per cardiology this may have likely been the cause of her stroke.  Patient's heart rate remained well controlled.  Cardiology recommended starting patient on a DOAC and patient was started on Eliquis 5 mg p.o. twice daily.  Patient will be discharged home in stable and improved condition to follow-up with her primary cardiologist, Dr. Marlou Porch in about 2 weeks.  Patient be discharged in stable and improved condition.  3.  Hypertension Remained stable on home regimen of atenolol/chlorthalidone and Norvasc.  4.  Well-controlled diabetes mellitus type 2 Hemoglobin A1c 6.7.    Patient's oral hypoglycemic agents were held during the hospitalization and patient maintained on a sliding scale insulin.  Oral hypoglycemic agents will be resumed on discharge.    5.  Hyperlipidemia Well-controlled.  LDL of 47.  Patient maintained on statin.  6.  Multinodular thyroid Noted on CT angiogram head and neck.  TSH done within normal limits.  Patient asymptomatic.  Thyroid ultrasound was done during his hospitalization and will need outpatient follow-up with PCP for further evaluation and management.   Procedures:  CT angiogram head and neck 09/05/2019  Chest x-ray 09/04/2019  MRI brain 09/04/2019  2D echo 09/04/2019  Carotid Dopplers 09/06/2019  Thyroid ultrasound 09/06/2019  Consultations:  Neurology: Dr.Arora 09/04/2019  Cardiology: Dr.Nahser 09/05/2019   Discharge Exam: Vitals:   09/06/19 0405 09/06/19 1210  BP: 120/67 132/67  Pulse: 62 68  Resp: 16 18  Temp: 98.2 F (36.8 C) 98.2 F (36.8 C)  SpO2: 99% 100%    General: NAD Cardiovascular: RRR Respiratory: CTAB  Discharge Instructions   Discharge Instructions    Diet - low sodium heart healthy   Complete by: As directed    Increase activity slowly   Complete by: As directed      Allergies as of 09/06/2019      Reactions   Lipitor [atorvastatin]  Bloating      Medication List    STOP taking these medications   aspirin 81 MG tablet     TAKE these medications   acetaminophen 500 MG tablet Commonly known as: TYLENOL Take 1,000 mg by mouth every 6 (six) hours as needed for mild pain or headache.   alendronate 70 MG tablet Commonly known as: FOSAMAX Take 70 mg by mouth once a week.   amLODipine 2.5 MG tablet Commonly known as: NORVASC Take 2.5 mg by mouth daily.   apixaban 5 MG Tabs tablet Commonly known as: ELIQUIS Take 1 tablet (5 mg total) by mouth 2 (two) times daily.   atenolol-chlorthalidone 100-25 MG tablet Commonly known as: TENORETIC Take 1 tablet by mouth daily.   lovastatin 40 MG tablet Commonly known as: MEVACOR Take 40 mg by mouth at bedtime.   metFORMIN 500 MG tablet Commonly known as: GLUCOPHAGE Take 500 mg by mouth daily with  breakfast.   multivitamin with minerals Tabs tablet Take 1 tablet by mouth daily with breakfast.   polyvinyl alcohol 1.4 % ophthalmic solution Commonly known as: LIQUIFILM TEARS Place 1 drop into both eyes every 4 (four) hours as needed for dry eyes.   psyllium 0.52 g capsule Commonly known as: REGULOID Take 0.52 g by mouth daily with breakfast.   traMADol 50 MG tablet Commonly known as: ULTRAM Take 50 mg by mouth every 12 (twelve) hours as needed for moderate pain.            Durable Medical Equipment  (From admission, onward)         Start     Ordered   09/06/19 1503  For home use only DME 4 wheeled rolling walker with seat  Once    Question:  Patient needs a walker to treat with the following condition  Answer:  CVA (cerebral vascular accident) ([place])   09/06/19 1502   09/06/19 1503  For home use only DME 3 n 1  Once     09/06/19 1502         Allergies  Allergen Reactions  . Lipitor [Atorvastatin]     Bloating   Follow-up Information    Koirala, Dibas, MD. Schedule an appointment as soon as possible for a visit in 2 week(s).   Specialty: Family Medicine Contact information: [address] [place] [address] [phone]        Jerline Pain, MD. Schedule an appointment as soon as possible for a visit in 2 week(s).   Specialty: Cardiology Contact information: [id number] [place] [address] [phone]        Garvin Fila, MD. Schedule an appointment as soon as possible for a visit in 4 week(s).   Specialties: Neurology, Radiology Why: Follow-up in 4 to 6 weeks. Contact information: [address] [place] [address] [phone]            The results of significant diagnostics from this hospitalization (including imaging, microbiology, ancillary and laboratory) are listed below for reference.    Significant Diagnostic Studies: CT ANGIO HEAD W OR WO CONTRAST  Result Date:  09/05/2019 CLINICAL DATA:  Stroke/TIA. Assess for intracardiac shunt. EXAM: CT ANGIOGRAPHY HEAD AND NECK TECHNIQUE: Multidetector CT imaging of the head and neck was performed using the standard protocol during bolus administration of intravenous contrast. Multiplanar CT image reconstructions and MIPs were obtained to evaluate the vascular anatomy. Carotid stenosis measurements (when applicable) are obtained utilizing NASCET criteria,  using the distal internal carotid diameter as the denominator. CONTRAST:  143mL OMNIPAQUE IOHEXOL 350 MG/ML SOLN COMPARISON:  MRI of the brain September 04, 2019. FINDINGS: CT HEAD FINDINGS Brain: No evidence of hemorrhage, hydrocephalus, extra-axial collection or mass lesion/mass effect. Hypodense focus in the left thalamus corresponds to acute infarct described on prior MRI. Hypodensity of the periventricular white matter extending into the centrum semiovale bilaterally most likely represents chronic small vessel ischemia. Prominence of the supratentorial ventricles and cerebral sulci is related to parenchymal volume loss. Vascular: No hyperdense vessel or unexpected calcification. Skull: Normal. Negative for fracture or focal lesion. Sinuses: Imaged portions are clear. Orbits: No acute finding. Review of the MIP images confirms the above findings CTA NECK FINDINGS Aortic arch: Common origin of the innominate and left common carotid artery. Imaged portion shows no evidence of aneurysm or dissection. Mild calcified atherosclerotic plaques are seen in the aortic arch. No significant stenosis of the major arch vessel origins. Right carotid system: Calcified plaque is seen in the right carotid bulb without hemodynamically significant stenosis. Left carotid system: Calcified plaque is seen in the left parotid bulb, without hemodynamically significant stenosis. Vertebral arteries: The left vertebral artery is dominant. No evidence of dissection, stenosis (50% or greater) or occlusion.  Skeleton: Elongated styloid process on the right side. Otherwise negative. Other neck: Enlarged multinodular thyroid gland. Upper chest: Negative Review of the MIP images confirms the above findings CTA HEAD FINDINGS Anterior circulation: No significant stenosis, proximal occlusion, aneurysm, or vascular malformation. Small calcified plaques are seen in the bilateral carotid siphons Posterior circulation: Attenuation of the left PCA branches are noted at the P2A/P2P junction, may represent atherosclerotic disease versus subocclusive clot. Venous sinuses: As permitted by contrast timing, patent. Anatomic variants: No clinical significant anatomical variation. Review of the MIP images confirms the above findings IMPRESSION: 1. Attenuation of left PCA branches at the P2A/P2P junction, may represent atherosclerotic disease versus subocclusive clot. 2. No hemodynamically significant stenosis of the carotid arteries in the neck. 3. Hypodense focus in the left thalamus corresponds to acute infarct described on prior MRI. 4. Enlarged multinodular thyroid gland. Consider ultrasound for characterization. Aortic Atherosclerosis (ICD10-I70.0). Electronically Signed   By: Pedro Earls M.D.   On: 09/05/2019 10:54   DG Chest 2 View  Result Date: 09/04/2019 CLINICAL DATA:  Stroke. EXAM: CHEST - 2 VIEW COMPARISON:  Single view of the chest 04/27/2017. FINDINGS: There is cardiomegaly and mild vascular congestion. Atherosclerosis. No consolidative process, pneumothorax or effusion. Scoliosis noted. IMPRESSION: Cardiomegaly and vascular congestion. Atherosclerosis. Electronically Signed   By: Inge Rise M.D.   On: 09/04/2019 14:20   CT ANGIO NECK W OR WO CONTRAST  Result Date: 09/05/2019 CLINICAL DATA:  Stroke/TIA. Assess for intracardiac shunt. EXAM: CT ANGIOGRAPHY HEAD AND NECK TECHNIQUE: Multidetector CT imaging of the head and neck was performed using the standard protocol during bolus administration  of intravenous contrast. Multiplanar CT image reconstructions and MIPs were obtained to evaluate the vascular anatomy. Carotid stenosis measurements (when applicable) are obtained utilizing NASCET criteria, using the distal internal carotid diameter as the denominator. CONTRAST:  184mL OMNIPAQUE IOHEXOL 350 MG/ML SOLN COMPARISON:  MRI of the brain September 04, 2019. FINDINGS: CT HEAD FINDINGS Brain: No evidence of hemorrhage, hydrocephalus, extra-axial collection or mass lesion/mass effect. Hypodense focus in the left thalamus corresponds to acute infarct described on prior MRI. Hypodensity of the periventricular white matter extending into the centrum semiovale bilaterally most likely represents chronic small vessel ischemia. Prominence of the  supratentorial ventricles and cerebral sulci is related to parenchymal volume loss. Vascular: No hyperdense vessel or unexpected calcification. Skull: Normal. Negative for fracture or focal lesion. Sinuses: Imaged portions are clear. Orbits: No acute finding. Review of the MIP images confirms the above findings CTA NECK FINDINGS Aortic arch: Common origin of the innominate and left common carotid artery. Imaged portion shows no evidence of aneurysm or dissection. Mild calcified atherosclerotic plaques are seen in the aortic arch. No significant stenosis of the major arch vessel origins. Right carotid system: Calcified plaque is seen in the right carotid bulb without hemodynamically significant stenosis. Left carotid system: Calcified plaque is seen in the left parotid bulb, without hemodynamically significant stenosis. Vertebral arteries: The left vertebral artery is dominant. No evidence of dissection, stenosis (50% or greater) or occlusion. Skeleton: Elongated styloid process on the right side. Otherwise negative. Other neck: Enlarged multinodular thyroid gland. Upper chest: Negative Review of the MIP images confirms the above findings CTA HEAD FINDINGS Anterior  circulation: No significant stenosis, proximal occlusion, aneurysm, or vascular malformation. Small calcified plaques are seen in the bilateral carotid siphons Posterior circulation: Attenuation of the left PCA branches are noted at the P2A/P2P junction, may represent atherosclerotic disease versus subocclusive clot. Venous sinuses: As permitted by contrast timing, patent. Anatomic variants: No clinical significant anatomical variation. Review of the MIP images confirms the above findings IMPRESSION: 1. Attenuation of left PCA branches at the P2A/P2P junction, may represent atherosclerotic disease versus subocclusive clot. 2. No hemodynamically significant stenosis of the carotid arteries in the neck. 3. Hypodense focus in the left thalamus corresponds to acute infarct described on prior MRI. 4. Enlarged multinodular thyroid gland. Consider ultrasound for characterization. Aortic Atherosclerosis (ICD10-I70.0). Electronically Signed   By: Pedro Earls M.D.   On: 09/05/2019 10:54   MR BRAIN WO CONTRAST  Result Date: 09/04/2019 CLINICAL DATA:  Acute neuro deficit.  Right-sided numbness. EXAM: MRI HEAD WITHOUT CONTRAST TECHNIQUE: Multiplanar, multiecho pulse sequences of the brain and surrounding structures were obtained without intravenous contrast. COMPARISON:  None. FINDINGS: Brain: Acute infarct left thalamus measuring under 1 cm. No other acute infarct. Generalized atrophy. Multiple small periventricular deep white matter hyperintensities bilaterally most consistent with chronic microvascular ischemia. Negative for hemorrhage mass or fluid collection. Vascular: Normal arterial flow voids Skull and upper cervical spine: No focal skeletal lesion. Sinuses/Orbits: Mild mucosal edema paranasal sinuses. Negative orbit Other: None IMPRESSION: Subcentimeter acute infarct left thalamus. Atrophy and mild chronic microvascular ischemic change in the white matter. Electronically Signed   By: Franchot Gallo  M.D.   On: 09/04/2019 11:30   ECHOCARDIOGRAM COMPLETE  Result Date: 09/04/2019    ECHOCARDIOGRAM REPORT   Patient Name:   MATTHEW DAGGETT Date of Exam: 09/04/2019 Medical Rec #:  [id number]       Height:       64.0 in Accession #:    [id number]      Weight:       160.0 lb Date of Birth:  06-16-1936        BSA:          1.78 m Patient Age:    84 years        BP:           112/73 mmHg Patient Gender: F               HR:           67 bpm. Exam Location:  Inpatient Procedure: 2D Echo Indications:  stroke 434.91  History:        Patient has no prior history of Echocardiogram examinations.                 Arrythmias:Atrial Fibrillation; Risk Factors:Diabetes,                 Hypertension, Dyslipidemia and Current Smoker.  Sonographer:    Johny Chess Referring Phys: [id number] [place]  1. Left ventricular ejection fraction, by estimation, is 55 to 60%. The left ventricle has normal function. The left ventricle has no regional wall motion abnormalities. Left ventricular diastolic parameters are indeterminate.  2. Right ventricular systolic function is normal. The right ventricular size is normal. There is moderately elevated pulmonary artery systolic pressure.  3. Left atrial size was moderately dilated.  4. Right atrial size was mildly dilated.  5. The mitral valve is normal in structure and function. Mild to moderate mitral valve regurgitation.  6. Tricuspid valve regurgitation is mild to moderate.  7. The aortic valve is tricuspid. Aortic valve regurgitation is not visualized. No aortic stenosis is present.  8. The inferior vena cava is normal in size with greater than 50% respiratory variability, suggesting right atrial pressure of 3 mmHg. Conclusion(s)/Recomendation(s): No intracardiac source of embolism detected on this transthoracic study. A transesophageal echocardiogram is recommended to exclude cardiac source of embolism if clinically indicated. FINDINGS  Left Ventricle: Left  ventricular ejection fraction, by estimation, is 55 to 60%. The left ventricle has normal function. The left ventricle has no regional wall motion abnormalities. The left ventricular internal cavity size was normal in size. There is  no left ventricular hypertrophy. Left ventricular diastolic parameters are indeterminate. Right Ventricle: The right ventricular size is normal. No increase in right ventricular wall thickness. Right ventricular systolic function is normal. There is moderately elevated pulmonary artery systolic pressure. The tricuspid regurgitant velocity is 3.13 m/s, and with an assumed right atrial pressure of 3 mmHg, the estimated right ventricular systolic pressure is [id number] mmHg. Left Atrium: Left atrial size was moderately dilated. Right Atrium: Right atrial size was mildly dilated. Pericardium: Trivial pericardial effusion is present. Mitral Valve: The mitral valve is normal in structure and function. Mild to moderate mitral valve regurgitation. Tricuspid Valve: The tricuspid valve is normal in structure. Tricuspid valve regurgitation is mild to moderate. Aortic Valve: The aortic valve is tricuspid. Aortic valve regurgitation is not visualized. No aortic stenosis is present. Pulmonic Valve: The pulmonic valve was grossly normal. Pulmonic valve regurgitation is trivial. Aorta: The aortic root, ascending aorta and aortic arch are all structurally normal, with no evidence of dilitation or obstruction. Venous: The inferior vena cava is normal in size with greater than 50% respiratory variability, suggesting right atrial pressure of 3 mmHg. IAS/Shunts: No atrial level shunt detected by color flow Doppler.  LEFT VENTRICLE PLAX 2D LVIDd:         4.80 cm  Diastology LVIDs:         3.40 cm  LV e' lateral:   10.10 cm/s LV PW:         0.90 cm  LV E/e' lateral: 10.4 LV IVS:        0.80 cm  LV e' medial:    8.59 cm/s LVOT diam:     2.00 cm  LV E/e' medial:  12.2 LV SV:         76.34 ml LV SV Index:   32.85 LVOT  Area:     3.14 cm  RIGHT  VENTRICLE RV S prime:     12.50 cm/s TAPSE (M-mode): 2.7 cm LEFT ATRIUM             Index       RIGHT ATRIUM           Index LA diam:        4.50 cm 2.53 cm/m  RA Area:     18.30 cm LA Vol (A2C):   78.8 ml 44.29 ml/m RA Volume:   47.40 ml  26.64 ml/m LA Vol (A4C):   70.6 ml 39.68 ml/m LA Biplane Vol: 76.3 ml 42.88 ml/m  AORTIC VALVE LVOT Vmax:   114.00 cm/s LVOT Vmean:  78.000 cm/s LVOT VTI:    0.243 m  AORTA Ao Root diam: 2.60 cm MITRAL VALVE                TRICUSPID VALVE MV Area (PHT): 3.53 cm     TR Peak grad:   39.2 mmHg MV Decel Time: 215 msec     TR Vmax:        313.00 cm/s MV E velocity: 105.00 cm/s MV A velocity: 65.60 cm/s   SHUNTS MV E/A ratio:  1.60         Systemic VTI:  0.24 m                             Systemic Diam: 2.00 cm Buford Dresser MD Electronically signed by Buford Dresser MD Signature Date/Time: 09/04/2019/9:08:38 PM    Final    VAS US CAROTID (at [facility] and [facility] only)  Result Date: 09/06/2019 Carotid Arterial Duplex Study Indications:       CVA. Comparison Study:  No prior exam. Performing Technologist: Baldwin Crown ARDMS, RVT  Examination Guidelines: A complete evaluation includes B-mode imaging, spectral Doppler, color Doppler, and power Doppler as needed of all accessible portions of each vessel. Bilateral testing is considered an integral part of a complete examination. Limited examinations for reoccurring indications may be performed as noted.  Right Carotid Findings: +----------+--------+--------+--------+------------------+--------+           PSV cm/sEDV cm/sStenosisPlaque DescriptionComments +----------+--------+--------+--------+------------------+--------+ CCA Prox  98      16                                         +----------+--------+--------+--------+------------------+--------+ CCA Distal89      17                                         +----------+--------+--------+--------+------------------+--------+  ICA Prox  96      24      1-39%   heterogenous               +----------+--------+--------+--------+------------------+--------+ ICA Distal90      26                                         +----------+--------+--------+--------+------------------+--------+ ECA       87      10                                         +----------+--------+--------+--------+------------------+--------+ +----------+--------+-------+----------------+-------------------+  PSV cm/sEDV cmsDescribe        Arm Pressure (mmHG) +----------+--------+-------+----------------+-------------------+ [id number]            Multiphasic, WNL                    +----------+--------+-------+----------------+-------------------+ +---------+--------+--+--------+-+---------+ VertebralPSV cm/s45EDV cm/s9Antegrade +---------+--------+--+--------+-+---------+  Left Carotid Findings: +----------+--------+--------+--------+------------------+--------+           PSV cm/sEDV cm/sStenosisPlaque DescriptionComments +----------+--------+--------+--------+------------------+--------+ CCA Prox  86      15                                         +----------+--------+--------+--------+------------------+--------+ CCA Distal69      16                                         +----------+--------+--------+--------+------------------+--------+ ICA Prox  60      17      1-39%   heterogenous               +----------+--------+--------+--------+------------------+--------+ ICA Distal84      26                                         +----------+--------+--------+--------+------------------+--------+ ECA       104     9                                          +----------+--------+--------+--------+------------------+--------+ +----------+--------+--------+----------------+-------------------+           PSV cm/sEDV cm/sDescribe        Arm Pressure (mmHG)  +----------+--------+--------+----------------+-------------------+ [id number]             Multiphasic, WNL                    +----------+--------+--------+----------------+-------------------+ +---------+--------+--+--------+--+---------+ VertebralPSV cm/s45EDV cm/s15Antegrade +---------+--------+--+--------+--+---------+   Summary: Right Carotid: Velocities in the right ICA are consistent with a 1-39% stenosis. Left Carotid: Velocities in the left ICA are consistent with a 1-39% stenosis. Vertebrals:  Bilateral vertebral arteries demonstrate antegrade flow. Subclavians: Normal flow hemodynamics were seen in bilateral subclavian              arteries. *See table(s) above for measurements and observations.  Electronically signed by Antony Contras MD on 09/06/2019 at 7:45:54 AM.    Final     Microbiology: Recent Results (from the past 240 hour(s))  SARS CORONAVIRUS 2 (TAT 6-24 HRS) Nasopharyngeal Nasopharyngeal Swab     Status: None   Collection Time: 09/04/19 12:13 PM   Specimen: Nasopharyngeal Swab  Result Value Ref Range Status   SARS Coronavirus 2 NEGATIVE NEGATIVE Final    Comment: (NOTE) SARS-CoV-2 target nucleic acids are NOT DETECTED. The SARS-CoV-2 RNA is generally detectable in upper and lower respiratory specimens during the acute phase of infection. Negative results do not preclude SARS-CoV-2 infection, do not rule out co-infections with other pathogens, and should not be used as the sole basis for treatment or other patient management decisions. Negative results must be combined with clinical observations, patient history, and epidemiological information. The expected result is Negative. Fact Sheet for Patients: SugarRoll.be Fact Sheet  for Healthcare Providers: https://www.woods-mathews.com/ This test is not yet approved or cleared by the [place] and  has been authorized for detection and/or diagnosis of SARS-CoV-2  by FDA under an Emergency Use Authorization (EUA). This EUA will remain  in effect (meaning this test can be used) for the duration of the COVID-19 declaration under Section 56 4(b)(1) of the Act, 21 U.S.C. section 360bbb-3(b)(1), unless the authorization is terminated or revoked sooner. Performed at [facility], [place] [address], [place],  [address]      Labs: Basic Metabolic Panel: Recent Labs  Lab 09/04/19 0934 09/05/19 0247 09/05/19 1141 09/06/19 0556  NA 140 140  --  141  K 4.0 3.1*  --  3.4*  CL 101 103  --  104  CO2 24 26  --  28  GLUCOSE 148* 152*  --  139*  BUN 13 13  --  6*  CREATININE 0.63 0.61  --  0.54  CALCIUM 9.5 9.1  --  9.2  MG  --   --  1.9  --    Liver Function Tests: Recent Labs  Lab 09/04/19 0934  AST 43*  ALT 29  ALKPHOS 91  BILITOT 1.1  PROT 6.6  ALBUMIN 3.9   No results for input(s): LIPASE, AMYLASE in the last 168 hours. No results for input(s): AMMONIA in the last 168 hours. CBC: Recent Labs  Lab 09/04/19 0934 09/05/19 0247 09/06/19 0556  WBC 7.3 6.6 5.6  NEUTROABS 4.6  --   --   HGB 13.2 11.4* 11.5*  HCT 40.8 34.9* 34.9*  MCV 96.2 94.3 93.8  PLT 242 200 187   Cardiac Enzymes: No results for input(s): CKTOTAL, CKMB, CKMBINDEX, TROPONINI in the last 168 hours. BNP: BNP (last 3 results) No results for input(s): BNP in the last 8760 hours.  ProBNP (last 3 results) No results for input(s): PROBNP in the last 8760 hours.  CBG: Recent Labs  Lab 09/05/19 1045 09/05/19 1616 09/05/19 2132 09/06/19 0630 09/06/19 1048  GLUCAP 135* 130* 150* 131* 122*       Signed:  Irine Seal MD.  [facility] 09/06/2019, 3:09 PM

## 2019-09-06 NOTE — Progress Notes (Signed)
Physical Therapy Treatment Patient Details Name: Kelly Bates MRN: [id number] DOB: 01/25/1936 Today's Date: 09/06/2019    History of Present Illness Pt is an 84 y.o. female admitted 09/04/19 with headache and R-side numbness; pt had initially called EMS but declined transfer to hospital, pt came to ED after symptoms worsened. MRI showed subcentimeter L thalamic infarct. Also noted to have afib. PMH includes HTN, DM, HLD.    PT Comments    Pt demonstrating functional improvement today compared to initial evaluation. Pt was able to demonstrate donning socks without assistance (trunk flexed in sitting to reach feet on floor, unable to bring feet up to opposite knees), and functional mobility with min guard assist to supervision for safety and RW for support. She scored an 18/24 on the DGI, indicating pt is at a higher risk for falls, however with the walker, she did not appear significantly unsteady, and was without overt LOB throughout session. Pt reports she could have good family support/supervision upon return home, and states she feels she could manage well at home as long as HHPT/OT would be following up with her. Based on performance today, it appears that from a PT standpoint pt is most appropriate for return home with [facility] therapies to follow-up. I updated [name], Rehab Admissions Coordinator on pt's current functional status after session. Will continue to follow and progress as able per POC.    Follow Up Recommendations  Home health PT;Supervision for mobility/OOB     Equipment Recommendations  Rolling walker with 5" wheels;3in1 (PT)    Recommendations for Other Services       Precautions / Restrictions Precautions Precautions: Fall Restrictions Weight Bearing Restrictions: No    Mobility  Bed Mobility Overal bed mobility: Needs Assistance Bed Mobility: Supine to Sit     Supine to sit: Supervision     General bed mobility comments: HOB flat and min use of rails. Pt was  able to transition to EOB without assistance with increased time.   Transfers Overall transfer level: Needs assistance Equipment used: Rolling walker (2 wheeled) Transfers: Sit to/from Stand Sit to Stand: Supervision         General transfer comment: Close supervision for safety as pt powered up to full stand. VC's for hand placement on seated surface for safety.   Ambulation/Gait Ambulation/Gait assistance: Min guard;Supervision Gait Distance (Feet): 350 Feet Assistive device: Rolling walker (2 wheeled) Gait Pattern/deviations: Step-through pattern;Decreased stride length;Trunk flexed Gait velocity: Decreased Gait velocity interpretation: <1.8 ft/sec, indicate of risk for recurrent falls General Gait Details: Min guard assist initially progressing to supervision for safety. Pt was able to ambulate well in the hall with smooth gait pattern and improved posture with walker support.    Stairs Stairs: Yes Stairs assistance: Min guard Stair Management: Two rails;Alternating pattern;Step to pattern;Forwards Number of Stairs: 5 General stair comments: Alternating ascending and step-to descending. No assist but close guard provided for safety.    Wheelchair Mobility    Modified Rankin (Stroke Patients Only) Modified Rankin (Stroke Patients Only) Pre-Morbid Rankin Score: No symptoms Modified Rankin: Moderately severe disability     Balance Overall balance assessment: Needs assistance Sitting-balance support: No upper extremity supported;Feet supported Sitting balance-Leahy Scale: Fair     Standing balance support: Bilateral upper extremity supported;No upper extremity supported;During functional activity Standing balance-Leahy Scale: Poor Standing balance comment: reliant on UE and external support dynamically, able to stand statically without UE support during grooming but with fatigue fades to needing mod assist with R lateral lean to  maintain balance                   Standardized Balance Assessment Standardized Balance Assessment : Dynamic Gait Index   Dynamic Gait Index Level Surface: Mild Impairment Change in Gait Speed: Moderate Impairment Gait with Horizontal Head Turns: Normal Gait with Vertical Head Turns: Normal Gait and Pivot Turn: Normal Step Over Obstacle: Mild Impairment Step Around Obstacles: Mild Impairment Steps: Mild Impairment Total Score: 18      Cognition Arousal/Alertness: Awake/alert Behavior During Therapy: WFL for tasks assessed/performed Overall Cognitive Status: Within Functional Limits for tasks assessed                           Safety/Judgement: Decreased awareness of deficits;Decreased awareness of safety Awareness: Emergent Problem Solving: Requires verbal cues        Exercises      General Comments        Pertinent Vitals/Pain Pain Assessment: No/denies pain    Home Living                      Prior Function            PT Goals (current goals can now be found in the care plan section) Acute Rehab PT Goals Patient Stated Goal: Hopeful for return home PT Goal Formulation: With patient Time For Goal Achievement: 09/19/19 Potential to Achieve Goals: Good Progress towards PT goals: Progressing toward goals    Frequency    Min 4X/week      PT Plan Discharge plan needs to be updated;Equipment recommendations need to be updated    Co-evaluation              AM-PAC PT "6 Clicks" Mobility   Outcome Measure  Help needed turning from your back to your side while in a flat bed without using bedrails?: None Help needed moving from lying on your back to sitting on the side of a flat bed without using bedrails?: None Help needed moving to and from a bed to a chair (including a wheelchair)?: A Little Help needed standing up from a chair using your arms (e.g., wheelchair or bedside chair)?: None Help needed to walk in hospital room?: A Little Help needed climbing 3-5  steps with a railing? : A Little 6 Click Score: 21    End of Session Equipment Utilized During Treatment: Gait belt Activity Tolerance: Patient tolerated treatment well Patient left: in chair;with call bell/phone within reach;with chair alarm set(in room with OT) Nurse Communication: Mobility status PT Visit Diagnosis: Other abnormalities of gait and mobility (R26.89);Other symptoms and signs involving the nervous system (R29.898);Difficulty in walking, not elsewhere classified (R26.2)     Time: 1350-1415 PT Time Calculation (min) (ACUTE ONLY): 25 min  Charges:  $Gait Training: 8-22 mins $Physical Performance Test: 8-22 mins                     Rolinda Roan, PT, DPT Acute Rehabilitation Services Pager: [phone] Office: [phone]    Thelma Comp 09/06/2019, 2:31 PM

## 2019-09-06 NOTE — Progress Notes (Signed)
Discharge instructions reviewed with pt.  Copy of instructions given to pt, script eliquis was filled by [facility] care pharmacy and was delivered to pt's room to take home. Pt verbalized understanding of instructions, reviewed stroke education handouts with pt and pt given handouts. Pt's son called by pt and is coming to pick pt up and will call her when he arrives so staff can take her out to his car. Pt equipment was delivered to her room, 3 in 1 and rolator, this will go home with pt.

## 2019-09-06 NOTE — TOC Initial Note (Addendum)
Transition of Care [facility]) - Initial/Assessment Note    Patient Details  Name: MECHEL PATRAS MRN: [id number] Date of Birth: 29-May-1936  Transition [facility]) CM/SW Contact:    Marilu Favre, RN Phone Number: 09/06/2019, 3:40 PM  Clinical Narrative:                  Discussed discahrge planning with patient at bedside. Confirmed face sheet information with patient. Patient from home with son. Was denied CIR by insurance, discussed SNF patient declined wants to go home. Asked to call son to discuss plan , patient declined, stating she will call son.   Offered choice for home health, patient would like [facility] called [facility] awaiting call back.   Called for Rolator and 3 in 1 .   Provided 30 free Eliquis card. Entered benefit check.  Tommi Rumps accepted home health referral Expected Discharge Plan: [place] Barriers to Discharge: No Barriers Identified   Patient Goals and CMS Choice Patient states their goals for this hospitalization and ongoing recovery are:: to go home CMS Medicare.gov Compare Post Acute Care list provided to:: Patient Choice offered to / list presented to : Patient  Expected Discharge Plan and Services Expected Discharge Plan: [place]   Discharge Planning Services: CM Consult Post Acute Care Choice: Home Health, Durable Medical Equipment Living arrangements for the past 2 months: Single Family Home Expected Discharge Date: 09/06/19               DME Arranged: Berta Minor rolling with seat DME Agency: [employer] Date DME Agency Contacted: 09/06/19 Time DME Agency Contacted: [phone] Representative spoke with at DME Agency: [place] Arranged: RN, PT, OT, Speech Therapy [place] Agency: [place] Date [place]: 09/06/19 Time HH Agency Contacted: [phone]    Prior Living Arrangements/Services Living arrangements for the past 2 months: [place] with:: Adult Children Patient language and  need for interpreter reviewed:: Yes Do you feel safe going back to the place where you live?: Yes      Need for Family Participation in Patient Care: Yes (Comment) Care giver support system in place?: Yes (comment) Current home services: DME Criminal Activity/Legal Involvement Pertinent to Current Situation/Hospitalization: No - Comment as needed  Activities of Daily Living Home Assistive Devices/Equipment: None ADL Screening (condition at time of admission) Patient's cognitive ability adequate to safely complete daily activities?: Yes Is the patient deaf or have difficulty hearing?: No Does the patient have difficulty seeing, even when wearing glasses/contacts?: No Does the patient have difficulty concentrating, remembering, or making decisions?: No Patient able to express need for assistance with ADLs?: Yes Does the patient have difficulty dressing or bathing?: No Independently performs ADLs?: Yes (appropriate for developmental age) Does the patient have difficulty walking or climbing stairs?: Yes Weakness of Legs: Right Weakness of Arms/Hands: Right  Permission Sought/Granted   Permission granted to share information with : No              Emotional Assessment Appearance:: Appears stated age Attitude/Demeanor/Rapport: Engaged Affect (typically observed): Accepting Orientation: : Oriented to Self, Oriented to Place, Oriented to  Time, Oriented to Situation Alcohol / Substance Use: Not Applicable Psych Involvement: No (comment)  Admission diagnosis:  Stroke (cerebrum) [facility]) [I63.9] Patient Active Problem List   Diagnosis Date Noted  . Multinodular thyroid   . Stroke (cerebrum) ([place]) 09/04/2019  . Atrial fibrillation, new onset ([place]) 09/04/2019  . Pain due to onychomycosis of  toenails of both feet 01/30/2019  . Diabetes mellitus without complication ([place]) [id number]  . Chest pain, atypical 10/23/2015  . Hyperlipidemia 10/23/2015  . Essential hypertension 10/23/2015  .  Tobacco use 10/23/2015   PCP:  Lujean Amel, MD Pharmacy:   [facility] [place], [place] - [address] AT [address] [place] [address] Phone: [phone] Fax: [place], [place] - [address] [place] [address] Phone: [phone] Fax: [phone]  [facility] [place] [place] - [address] [place] [address] Phone: [phone] Fax: [phone]     Social Determinants of Health (SDOH) Interventions    Readmission Risk Interventions No flowsheet data found.

## 2019-09-06 NOTE — Progress Notes (Signed)
STROKE TEAM PROGRESS NOTE   INTERVAL HISTORY Patient was confirmed to have afib by cardiology and has been stated on eliquis  Vitals:   09/05/19 1815 09/06/19 0025 09/06/19 0405 09/06/19 1210  BP: 116/70 119/65 120/67 132/67  Pulse: 70 68 62 68  Resp: 18 15 16 18   Temp: 98.4 F (36.9 C) 98.4 F (36.9 C) 98.2 F (36.8 C) 98.2 F (36.8 C)  TempSrc: Oral Oral Oral Oral  SpO2: 98% 96% 99% 100%  Weight:      Height:        CBC:  Recent Labs  Lab 09/04/19 0934 09/04/19 0934 09/05/19 0247 09/06/19 0556  WBC 7.3   < > 6.6 5.6  NEUTROABS 4.6  --   --   --   HGB 13.2   < > 11.4* 11.5*  HCT 40.8   < > 34.9* 34.9*  MCV 96.2   < > 94.3 93.8  PLT 242   < > 200 187   < > = values in this interval not displayed.    Basic Metabolic Panel:  Recent Labs  Lab 09/05/19 0247 09/05/19 1141 09/06/19 0556  NA 140  --  141  K 3.1*  --  3.4*  CL 103  --  104  CO2 26  --  28  GLUCOSE 152*  --  139*  BUN 13  --  6*  CREATININE 0.61  --  0.54  CALCIUM 9.1  --  9.2  MG  --  1.9  --    Lipid Panel:     Component Value Date/Time   CHOL 134 09/05/2019 0247   TRIG 70 09/05/2019 0247   HDL 73 09/05/2019 0247   CHOLHDL 1.8 09/05/2019 0247   VLDL 14 09/05/2019 0247   LDLCALC 47 09/05/2019 0247   HgbA1c:  Lab Results  Component Value Date   HGBA1C 6.7 (H) 09/05/2019   Urine Drug Screen: No results found for: LABOPIA, COCAINSCRNUR, LABBENZ, AMPHETMU, THCU, LABBARB  Alcohol Level No results found for: ETH  IMAGING past 48 hours CT ANGIO HEAD W OR WO CONTRAST  Result Date: 09/05/2019 CLINICAL DATA:  Stroke/TIA. Assess for intracardiac shunt. EXAM: CT ANGIOGRAPHY HEAD AND NECK TECHNIQUE: Multidetector CT imaging of the head and neck was performed using the standard protocol during bolus administration of intravenous contrast. Multiplanar CT image reconstructions and MIPs were obtained to evaluate the vascular anatomy. Carotid stenosis measurements (when applicable) are obtained  utilizing NASCET criteria, using the distal internal carotid diameter as the denominator. CONTRAST:  19mL OMNIPAQUE IOHEXOL 350 MG/ML SOLN COMPARISON:  MRI of the brain September 04, 2019. FINDINGS: CT HEAD FINDINGS Brain: No evidence of hemorrhage, hydrocephalus, extra-axial collection or mass lesion/mass effect. Hypodense focus in the left thalamus corresponds to acute infarct described on prior MRI. Hypodensity of the periventricular white matter extending into the centrum semiovale bilaterally most likely represents chronic small vessel ischemia. Prominence of the supratentorial ventricles and cerebral sulci is related to parenchymal volume loss. Vascular: No hyperdense vessel or unexpected calcification. Skull: Normal. Negative for fracture or focal lesion. Sinuses: Imaged portions are clear. Orbits: No acute finding. Review of the MIP images confirms the above findings CTA NECK FINDINGS Aortic arch: Common origin of the innominate and left common carotid artery. Imaged portion shows no evidence of aneurysm or dissection. Mild calcified atherosclerotic plaques are seen in the aortic arch. No significant stenosis of the major arch vessel origins. Right carotid system: Calcified plaque is seen in the right carotid bulb without  hemodynamically significant stenosis. Left carotid system: Calcified plaque is seen in the left parotid bulb, without hemodynamically significant stenosis. Vertebral arteries: The left vertebral artery is dominant. No evidence of dissection, stenosis (50% or greater) or occlusion. Skeleton: Elongated styloid process on the right side. Otherwise negative. Other neck: Enlarged multinodular thyroid gland. Upper chest: Negative Review of the MIP images confirms the above findings CTA HEAD FINDINGS Anterior circulation: No significant stenosis, proximal occlusion, aneurysm, or vascular malformation. Small calcified plaques are seen in the bilateral carotid siphons Posterior circulation:  Attenuation of the left PCA branches are noted at the P2A/P2P junction, may represent atherosclerotic disease versus subocclusive clot. Venous sinuses: As permitted by contrast timing, patent. Anatomic variants: No clinical significant anatomical variation. Review of the MIP images confirms the above findings IMPRESSION: 1. Attenuation of left PCA branches at the P2A/P2P junction, may represent atherosclerotic disease versus subocclusive clot. 2. No hemodynamically significant stenosis of the carotid arteries in the neck. 3. Hypodense focus in the left thalamus corresponds to acute infarct described on prior MRI. 4. Enlarged multinodular thyroid gland. Consider ultrasound for characterization. Aortic Atherosclerosis (ICD10-I70.0). Electronically Signed   By: Pedro Earls M.D.   On: 09/05/2019 10:54   CT ANGIO NECK W OR WO CONTRAST  Result Date: 09/05/2019 CLINICAL DATA:  Stroke/TIA. Assess for intracardiac shunt. EXAM: CT ANGIOGRAPHY HEAD AND NECK TECHNIQUE: Multidetector CT imaging of the head and neck was performed using the standard protocol during bolus administration of intravenous contrast. Multiplanar CT image reconstructions and MIPs were obtained to evaluate the vascular anatomy. Carotid stenosis measurements (when applicable) are obtained utilizing NASCET criteria, using the distal internal carotid diameter as the denominator. CONTRAST:  15mL OMNIPAQUE IOHEXOL 350 MG/ML SOLN COMPARISON:  MRI of the brain September 04, 2019. FINDINGS: CT HEAD FINDINGS Brain: No evidence of hemorrhage, hydrocephalus, extra-axial collection or mass lesion/mass effect. Hypodense focus in the left thalamus corresponds to acute infarct described on prior MRI. Hypodensity of the periventricular white matter extending into the centrum semiovale bilaterally most likely represents chronic small vessel ischemia. Prominence of the supratentorial ventricles and cerebral sulci is related to parenchymal volume loss.  Vascular: No hyperdense vessel or unexpected calcification. Skull: Normal. Negative for fracture or focal lesion. Sinuses: Imaged portions are clear. Orbits: No acute finding. Review of the MIP images confirms the above findings CTA NECK FINDINGS Aortic arch: Common origin of the innominate and left common carotid artery. Imaged portion shows no evidence of aneurysm or dissection. Mild calcified atherosclerotic plaques are seen in the aortic arch. No significant stenosis of the major arch vessel origins. Right carotid system: Calcified plaque is seen in the right carotid bulb without hemodynamically significant stenosis. Left carotid system: Calcified plaque is seen in the left parotid bulb, without hemodynamically significant stenosis. Vertebral arteries: The left vertebral artery is dominant. No evidence of dissection, stenosis (50% or greater) or occlusion. Skeleton: Elongated styloid process on the right side. Otherwise negative. Other neck: Enlarged multinodular thyroid gland. Upper chest: Negative Review of the MIP images confirms the above findings CTA HEAD FINDINGS Anterior circulation: No significant stenosis, proximal occlusion, aneurysm, or vascular malformation. Small calcified plaques are seen in the bilateral carotid siphons Posterior circulation: Attenuation of the left PCA branches are noted at the P2A/P2P junction, may represent atherosclerotic disease versus subocclusive clot. Venous sinuses: As permitted by contrast timing, patent. Anatomic variants: No clinical significant anatomical variation. Review of the MIP images confirms the above findings IMPRESSION: 1. Attenuation of left PCA branches at  the P2A/P2P junction, may represent atherosclerotic disease versus subocclusive clot. 2. No hemodynamically significant stenosis of the carotid arteries in the neck. 3. Hypodense focus in the left thalamus corresponds to acute infarct described on prior MRI. 4. Enlarged multinodular thyroid gland.  Consider ultrasound for characterization. Aortic Atherosclerosis (ICD10-I70.0). Electronically Signed   By: Pedro Earls M.D.   On: 09/05/2019 10:54   VAS US CAROTID (at [facility] and [facility] only)  Result Date: 09/06/2019 Carotid Arterial Duplex Study Indications:       CVA. Comparison Study:  No prior exam. Performing Technologist: Baldwin Crown ARDMS, RVT  Examination Guidelines: A complete evaluation includes B-mode imaging, spectral Doppler, color Doppler, and power Doppler as needed of all accessible portions of each vessel. Bilateral testing is considered an integral part of a complete examination. Limited examinations for reoccurring indications may be performed as noted.  Right Carotid Findings: +----------+--------+--------+--------+------------------+--------+           PSV cm/sEDV cm/sStenosisPlaque DescriptionComments +----------+--------+--------+--------+------------------+--------+ CCA Prox  98      16                                         +----------+--------+--------+--------+------------------+--------+ CCA Distal89      17                                         +----------+--------+--------+--------+------------------+--------+ ICA Prox  96      24      1-39%   heterogenous               +----------+--------+--------+--------+------------------+--------+ ICA Distal90      26                                         +----------+--------+--------+--------+------------------+--------+ ECA       87      10                                         +----------+--------+--------+--------+------------------+--------+ +----------+--------+-------+----------------+-------------------+           PSV cm/sEDV cmsDescribe        Arm Pressure (mmHG) +----------+--------+-------+----------------+-------------------+ [id number]            Multiphasic, WNL                    +----------+--------+-------+----------------+-------------------+  +---------+--------+--+--------+-+---------+ VertebralPSV cm/s45EDV cm/s9Antegrade +---------+--------+--+--------+-+---------+  Left Carotid Findings: +----------+--------+--------+--------+------------------+--------+           PSV cm/sEDV cm/sStenosisPlaque DescriptionComments +----------+--------+--------+--------+------------------+--------+ CCA Prox  86      15                                         +----------+--------+--------+--------+------------------+--------+ CCA Distal69      16                                         +----------+--------+--------+--------+------------------+--------+ ICA Prox  60  17      1-39%   heterogenous               +----------+--------+--------+--------+------------------+--------+ ICA Distal84      26                                         +----------+--------+--------+--------+------------------+--------+ ECA       104     9                                          +----------+--------+--------+--------+------------------+--------+ +----------+--------+--------+----------------+-------------------+           PSV cm/sEDV cm/sDescribe        Arm Pressure (mmHG) +----------+--------+--------+----------------+-------------------+ [id number]             Multiphasic, WNL                    +----------+--------+--------+----------------+-------------------+ +---------+--------+--+--------+--+---------+ VertebralPSV cm/s45EDV cm/s15Antegrade +---------+--------+--+--------+--+---------+   Summary: Right Carotid: Velocities in the right ICA are consistent with a 1-39% stenosis. Left Carotid: Velocities in the left ICA are consistent with a 1-39% stenosis. Vertebrals:  Bilateral vertebral arteries demonstrate antegrade flow. Subclavians: Normal flow hemodynamics were seen in bilateral subclavian              arteries. *See table(s) above for measurements and observations.  Electronically signed by Antony Contras MD on 09/06/2019 at 7:45:54 AM.    Final     PHYSICAL EXAM Pleasant elderly African-American lady not in distress. . Afebrile. Head is nontraumatic. Neck is supple without bruit.    Cardiac exam no murmur or gallop. Lungs are clear to auscultation. Distal pulses are well felt. Neurological Exam ;  Awake  Alert oriented x 3. Normal speech and language.eye movements full without nystagmus.fundi were not visualized. Vision acuity and fields appear normal. Hearing is normal. Palatal movements are normal. Face symmetric. Tongue midline. Normal strength, tone, reflexes and coordination except for mild decreased strength in the right grip and diminished right finger tapping movements and orbits left over right upper extremity.  Mild weakness of right hip flexors..  Diminished right hemibody sensation to touch and pinprick. Gait deferred.  ASSESSMENT/PLAN [name] is a 84 y.o. female with history of HTN and DB who had sudden onset R sided numbness that progressed to R sided weakness, presenting to [facility] ED. She reports hx palpitations for several months.  Stroke:   L thalamic infarct embolic secondary unknown source, suspicious for atrial fibrillation   MRI  Subcentimeter L thalamic infarct. Small vessel disease. Atrophy.   CTA head & neck pending   Carotid Doppler  pending   2D Echo EF 55-60%. No source of embolus   LDL 47  HgbA1c 6.7  SCDs for VTE prophylaxis  aspirin 81 mg daily prior to admission, now on aspirin 325 mg daily. Change to Eliquis given +AF  Therapy recommendations:  CIR  Disposition:  pending   Palpitations, new onset atrial fibrillation   Cardiology to see and evaluate-> confirmed AF -  Ok to start Eliquis from neuro standpoint -> ordered  Hypertension  Stable . Permissive hypertension (OK if < 220/120) but gradually normalize in 5-7 days . Long-term BP goal normotensive  Hyperlipidemia  Home meds:  mevacor 40  Now  on  pravachol 80  LDL 47, goal < 70  Continue statin at discharge  Diabetes type II Controlled  HgbA1c 6.7, goal < 7.0  Other Stroke Risk Factors  Advanced age  Cigarette smoker, smokes 5 cigarettes/day, advised to stop smoking  ETOH use,  advised to drink no more than 1 drink(s) a day  recommend weight loss, diet and exercise as appropriate   Family hx stroke (father)  Valvular heart disease  Other Active Problems  Hypokalemia   Hospital day # 2    She has presented with left thalamic infarct from small vessel disease but EKG is suspicious for atrial fibrillation.  Recommend Eliquis for atrial fibrillation.Continue  aggressive risk factor modification.  Discussed with Dr. Irine Seal, MD.   Stroke team will sign off.  Kindly call for questions Antony Contras, MD Medical Director [place] Pager: [phone] 09/06/2019 3:44 PM   To contact Stroke Continuity provider, please refer to [email or web address] After hours, contact General Neurology

## 2019-09-06 NOTE — Evaluation (Signed)
Speech Language Pathology Evaluation Patient Details Name: Kelly Bates MRN: [id number] DOB: Sep 07, 1935 Today's Date: 09/06/2019 Time: 1510-1530 SLP Time Calculation (min) (ACUTE ONLY): 20 min  Problem List:  Patient Active Problem List   Diagnosis Date Noted  . Multinodular thyroid   . Stroke (cerebrum) ([place]) 09/04/2019  . Atrial fibrillation, new onset ([place]) 09/04/2019  . Pain due to onychomycosis of toenails of both feet 01/30/2019  . Diabetes mellitus without complication ([place]) [id number]  . Chest pain, atypical 10/23/2015  . Hyperlipidemia 10/23/2015  . Essential hypertension 10/23/2015  . Tobacco use 10/23/2015   Past Medical History:  Past Medical History:  Diagnosis Date  . Arthritis   . Asthma    pt denies  . Diabetes mellitus   . Diverticulosis   . Gout   . Hypertension   . Osteoporosis   . Stroke [facility])    Past Surgical History:  Past Surgical History:  Procedure Laterality Date  . APPENDECTOMY    . COLONOSCOPY  05-2004   HPI:  Pt is an 84 y.o. female admitted 09/04/19 with headache and R-side numbness; pt had initially called EMS but declined transfer to hospital, pt came to ED after symptoms worsened. MRI showed subcentimeter L thalamic infarct. Also noted to have afib. PMH includes HTN, DM, HLD.   Assessment / Plan / Recommendation Clinical Impression   Pt's cognitive linguistic functioning was assessed via the COGNITSTAT. Per assessment results, pt presents with a mild impairment for the following: memory, awareness, problem solving, decision making/reasoning and safety/judgment. The pt stated she may have had some difficulties with these deficits PTA, but she is unsure exactly how she may have performed, and may be different from her basline. She currently lives with her son, but he works during the day, and she is home alone for long periods of time. However, she has nearby family who may be able to assist her intermittently/PRN. PTA, she independently kept  up with daily needs such as medications and keeping up with appointments. Considering her current deficits and that she is home alone for the majority of the day, recommend 24 hour supervision and home health speech therapy services. The pt was made aware of this plan and is in agreement.     SLP Assessment  SLP Recommendation/Assessment: All further Speech Lanaguage Pathology  needs can be addressed in the next venue of care SLP Visit Diagnosis: Cognitive communication deficit (R41.841)    Follow Up Recommendations  Home health SLP;24 hour supervision/assistance    Frequency and Duration           SLP Evaluation Cognition  Overall Cognitive Status: Impaired/Different from baseline Arousal/Alertness: Awake/alert Orientation Level: Oriented X4 Attention: Sustained Sustained Attention: Appears intact Memory: Impaired Memory Impairment: Decreased short term memory;Storage deficit Decreased Short Term Memory: Functional basic;Verbal basic Awareness: Impaired Awareness Impairment: Emergent impairment Problem Solving: Impaired Problem Solving Impairment: Verbal complex Executive Function: Decision Making Decision Making: Impaired Decision Making Impairment: Verbal complex Safety/Judgment: Impaired       Comprehension  Auditory Comprehension Overall Auditory Comprehension: Appears within functional limits for tasks assessed Visual Recognition/Discrimination Discrimination: Within Function Limits Reading Comprehension Reading Status: Within funtional limits    Expression Expression Primary Mode of Expression: Verbal Verbal Expression Overall Verbal Expression: Appears within functional limits for tasks assessed Written Expression Dominant Hand: Right Written Expression: Within Functional Limits   Oral / Motor  Oral Motor/Sensory Function Overall Oral Motor/Sensory Function: Within functional limits Motor Speech Overall Motor Speech: Appears within functional limits for  tasks assessed   [place], Student SLP Office: [phone]  09/06/2019, 3:49 PM

## 2019-09-06 NOTE — TOC Benefit Eligibility Note (Signed)
Transition of Care [facility]) Benefit Eligibility Note    Patient Details  Name: Kelly Bates MRN: [id number] Date of Birth: 1935/11/19   Medication/Dose: Arne Cleveland  5 MG BID  Covered?: Yes  Tier: 2 Drug  Prescription Coverage Preferred Pharmacy: Roseanne Kaufman with Person/Company/Phone Number:: [facility] [employer]  [phone] # [phone]  Co-Pay: $40.00  Prior Approval: No          Memory [place] Phone Number: 09/06/2019, 3:49 PM

## 2019-09-06 NOTE — Progress Notes (Signed)
Inpatient Rehabilitation-Admissions Coordinator   Received word from pt's insurance company that they are requesting a peer to peer. Discussed with Dr. Grandville Silos, he is willing to proceed with peer to peer conference. I have set up conference to be scheduled ASAP.   Will update once there has been a determination.   Raechel Ache, OTR/L  Rehab Admissions Coordinator  [phone] 09/06/2019 2:12 PM

## 2019-09-10 ENCOUNTER — Other Ambulatory Visit: Payer: Self-pay

## 2019-09-10 NOTE — Patient Outreach (Signed)
[facility]) Care Management  09/10/2019  Kelly Bates 10-30-1935 [id number]   EMMI- Stroke- Not on APL RED ON EMMI ALERT Day # 1 Date: 09/09/19 Red Alert Reason:  Scheduled follow up appointment? I don't know  Outreach attempt: Spoke with patient. She reports that she is doing ok.  She states that she has talked with PCP and waiting for call back for follow up appointment.  Patient states she still has some numbness to her leg but otherwise she thinks she is doing good.  Patient states that home health is involved and a visit is scheduled for today.  Patient has medications and denies any questions or concerns.    Advised patient on EMMI calls and nurse would outreach for any questionable answers that flag red. She verbalized understandings and is appreciative of call.    Plan: RN CM will close case.    Jone Baseman, RN, MSN [facility] Care Management Care Management Coordinator Direct Line [phone] Toll Free: [phone]  Fax: [phone]

## 2019-09-17 ENCOUNTER — Other Ambulatory Visit: Payer: Self-pay | Admitting: Family Medicine

## 2019-09-17 DIAGNOSIS — E010 Iodine-deficiency related diffuse (endemic) goiter: Secondary | ICD-10-CM

## 2019-09-20 ENCOUNTER — Other Ambulatory Visit: Payer: Medicare PPO

## 2019-09-26 ENCOUNTER — Other Ambulatory Visit: Payer: Self-pay | Admitting: Critical Care Medicine

## 2019-09-26 ENCOUNTER — Other Ambulatory Visit: Payer: Self-pay | Admitting: Family Medicine

## 2019-09-26 DIAGNOSIS — E041 Nontoxic single thyroid nodule: Secondary | ICD-10-CM

## 2019-10-11 ENCOUNTER — Ambulatory Visit: Payer: Medicare PPO | Admitting: Cardiology

## 2019-10-11 ENCOUNTER — Encounter: Payer: Self-pay | Admitting: *Deleted

## 2019-10-11 ENCOUNTER — Other Ambulatory Visit: Payer: Self-pay

## 2019-10-11 ENCOUNTER — Encounter: Payer: Self-pay | Admitting: Cardiology

## 2019-10-11 VITALS — BP 120/70 | HR 69 | Ht 64.0 in | Wt 162.0 lb

## 2019-10-11 DIAGNOSIS — Z01812 Encounter for preprocedural laboratory examination: Secondary | ICD-10-CM | POA: Diagnosis not present

## 2019-10-11 DIAGNOSIS — I4819 Other persistent atrial fibrillation: Secondary | ICD-10-CM | POA: Diagnosis not present

## 2019-10-11 DIAGNOSIS — I6381 Other cerebral infarction due to occlusion or stenosis of small artery: Secondary | ICD-10-CM

## 2019-10-11 DIAGNOSIS — I340 Nonrheumatic mitral (valve) insufficiency: Secondary | ICD-10-CM

## 2019-10-11 DIAGNOSIS — I639 Cerebral infarction, unspecified: Secondary | ICD-10-CM

## 2019-10-11 NOTE — Patient Instructions (Addendum)
Medication Instructions:  The current medical regimen is effective;  continue present plan and medications.  *If you need a refill on your cardiac medications before your next appointment, please call your pharmacy*  Lab Work: Please have blood work here Thursday 4/8 and then [place] screening as scheduled at the [facility].  If you have labs (blood work) drawn today and your tests are completely normal, you will receive your results only by: Marland Kitchen MyChart Message (if you have MyChart) OR . A paper copy in the mail If you have any lab test that is abnormal or we need to change your treatment, we will call you to review the results.   Testing/Procedures: Your physician has requested that you have a Cardioversion. Electrical Cardioversion uses a jolt of electricity to your heart either through paddles or wired patches attached to your chest. This is a controlled, usually prescheduled, procedure. This procedure is done at the hospital and you are not awake during the procedure. You usually go home the day of the procedure. Please see the instruction sheet given to you today for more information.  Follow-Up: At [facility], you and your health needs are our priority.  As part of our continuing mission to provide you with exceptional heart care, we have created designated Provider Care Teams.  These Care Teams include your primary Cardiologist (physician) and Advanced Practice Providers (APPs -  Physician Assistants and Nurse Practitioners) who all work together to provide you with the care you need, when you need it.  We recommend signing up for the patient portal called "MyChart".  Sign up information is provided on this After Visit Summary.  MyChart is used to connect with patients for Virtual Visits (Telemedicine).  Patients are able to view lab/test results, encounter notes, upcoming appointments, etc.  Non-urgent messages can be sent to your provider as well.   To learn more about what  you can do with MyChart, go to [email or web address].    Your next appointment:   4 week(s)  The format for your next appointment:   In Person  Provider:   You may see Candee Furbish, MD or one of the following Advanced Practice Providers on your designated Care Team:    Truitt Merle, NP  Cecilie Kicks, NP  Kathyrn Drown, NP    Thank you for choosing [facility]!!

## 2019-10-11 NOTE — Progress Notes (Signed)
Cardiology Office Note:    Date:  10/11/2019   ID:  Kelly Bates, DOB Jun 30, 1936, MRN [id number]  PCP:  Kelly Amel, MD  Cardiologist:  Kelly Furbish, MD  Electrophysiologist:  None   Referring MD: Kelly Amel, MD     History of Present Illness:    Kelly Bates is a 84 y.o. female here for the follow-up of atrial fibrillation.  She was seen in consultation by Dr. Liam Bates on 09/05/2019 in the hospital setting.  I saw her last in 2017 for atypical chest pain.  Stress test was low risk without ischemia.  During her hospitalization, she began feeling right arm numbness MRI brain showed acute left thalamus infarct.  EF 55 to 60% on echo.  Mild to moderate mitral regurgitation noted  She has had some intermittent flutters at times no fevers chills nausea vomiting syncope bleeding.  She still feels her atrial fibrillation.  This does not seem to be limiting her too much.  Overall reasonably rate controlled with her atenolol chlorthalidone.  I explained to her that I would like for her to get an opportunity at sinus rhythm at least once.  She has been compliant with her Eliquis.  No bleeding.  Past Medical History:  Diagnosis Date  . Arthritis   . Asthma    pt denies  . Diabetes mellitus   . Diverticulosis   . Gout   . Hypertension   . Osteoporosis   . Stroke [facility])     Past Surgical History:  Procedure Laterality Date  . APPENDECTOMY    . COLONOSCOPY  05-2004    Current Medications: Current Meds  Medication Sig  . acetaminophen (TYLENOL) 500 MG tablet Take 1,000 mg by mouth every 6 (six) hours as needed for mild pain or headache.  . alendronate (FOSAMAX) 70 MG tablet Take 70 mg by mouth once a week.  Marland Kitchen amLODipine (NORVASC) 2.5 MG tablet Take 2.5 mg by mouth daily.   Marland Kitchen apixaban (ELIQUIS) 5 MG TABS tablet Take 1 tablet (5 mg total) by mouth 2 (two) times daily.  Marland Kitchen atenolol-chlorthalidone (TENORETIC) 100-25 MG tablet Take 1 tablet by mouth daily.   Marland Kitchen lovastatin  (MEVACOR) 40 MG tablet Take 40 mg by mouth at bedtime.   . metFORMIN (GLUCOPHAGE) 500 MG tablet Take 500 mg by mouth daily with breakfast.   . Multiple Vitamin (MULTIVITAMIN WITH MINERALS) TABS tablet Take 1 tablet by mouth daily with breakfast.  . polyvinyl alcohol (LIQUIFILM TEARS) 1.4 % ophthalmic solution Place 1 drop into both eyes every 4 (four) hours as needed for dry eyes.  Marland Kitchen psyllium (REGULOID) 0.52 g capsule Take 0.52 g by mouth daily with breakfast.  . traMADol (ULTRAM) 50 MG tablet Take 50 mg by mouth every 12 (twelve) hours as needed for moderate pain.      Allergies:   Lipitor [atorvastatin]   Social History   Socioeconomic History  . Marital status: Divorced    Spouse name: Not on file  . Number of children: Not on file  . Years of education: Not on file  . Highest education level: Not on file  Occupational History  . Not on file  Tobacco Use  . Smoking status: Current Every Day Smoker    Packs/day: 0.50    Types: Cigarettes  . Smokeless tobacco: Never Used  Substance and Sexual Activity  . Alcohol use: Yes    Alcohol/week: 14.0 standard drinks    Types: 14 Glasses of wine per week  .  Drug use: No  . Sexual activity: Not Currently  Other Topics Concern  . Not on file  Social History Narrative  . Not on file   Social Determinants of Health   Financial Resource Strain:   . Difficulty of Paying Living Expenses:   Food Insecurity:   . Worried About Charity fundraiser in the Last Year:   . Arboriculturist in the Last Year:   Transportation Needs:   . Film/video editor (Medical):   Marland Kitchen Lack of Transportation (Non-Medical):   Physical Activity:   . Days of Exercise per Week:   . Minutes of Exercise per Session:   Stress:   . Feeling of Stress :   Social Connections:   . Frequency of Communication with Friends and Family:   . Frequency of Social Gatherings with Friends and Family:   . Attends Religious Services:   . Active Member of Clubs or  Organizations:   . Attends Archivist Meetings:   Marland Kitchen Marital Status:      Family History: The patient's family history includes CVA in her father; Diabetes in her mother; Hypertension in her mother; Lung cancer in her father. There is no history of Colon cancer, Esophageal cancer, Rectal cancer, or Stomach cancer.  ROS:   Please see the history of present illness.     All other systems reviewed and are negative.  EKGs/Labs/Other Studies Reviewed:    The following studies were reviewed today: EF 55%  EKG:  sinus rhythm 67 bpm no other abnormalities on 09/05/2019  Recent Labs: 09/04/2019: ALT 29 09/05/2019: Magnesium 1.9; TSH 1.781 09/06/2019: BUN 6; Creatinine, Ser 0.54; Hemoglobin 11.5; Platelets 187; Potassium 3.4; Sodium 141  Recent Lipid Panel    Component Value Date/Time   CHOL 134 09/05/2019 0247   TRIG 70 09/05/2019 0247   HDL 73 09/05/2019 0247   CHOLHDL 1.8 09/05/2019 0247   VLDL 14 09/05/2019 0247   LDLCALC 47 09/05/2019 0247    Physical Exam:    VS:  BP 120/70   Pulse 69   Ht 5\' 4"  (1.626 m)   Wt 162 lb (73.5 kg)   SpO2 96%   BMI 27.81 kg/m     Wt Readings from Last 3 Encounters:  10/11/19 162 lb (73.5 kg)  09/04/19 160 lb (72.6 kg)  04/27/17 170 lb (77.1 kg)     GEN:  Well nourished, well developed in no acute distress HEENT: Normal NECK: No JVD; No carotid bruits LYMPHATICS: No lymphadenopathy CARDIAC: irreg irreg slft systolic murmurs, rubs, gallops RESPIRATORY:  Clear to auscultation without rales, wheezing or rhonchi  ABDOMEN: Soft, non-tender, non-distended MUSCULOSKELETAL:  No edema; No deformity  SKIN: Warm and dry NEUROLOGIC:  Alert and oriented x 3 PSYCHIATRIC:  Normal affect   ASSESSMENT:    1. Persistent atrial fibrillation ([place])   2. Left thalamic infarction ([place])   3. Moderate mitral regurgitation   4. Pre-procedure lab exam    PLAN:    In order of problems listed above:  Rate controlled atrial  fibrillation -CHA2DS2-VASc 7 Eliquis Echo with normal EF -She has been compliant with her Eliquis.  She is still in atrial fibrillation today on physical exam.  We will go ahead and get her set up for a cardioversion to give her the opportunity for at least sinus rhythm once.  Discussed the risks and benefits.  Willing to proceed.  Left thalamic infarct -Neurology is followed.  Has an appointment upcoming.  She still has  some numbness she states in her right arm but her movement is better.  Mild to moderate mitral regurgitation and tricuspid regurgitation -We will continue to monitor with echocardiogram.   Medication Adjustments/Labs and Tests Ordered: Current medicines are reviewed at length with the patient today.  Concerns regarding medicines are outlined above.  Orders Placed This Encounter  Procedures  . Basic metabolic panel   No orders of the defined types were placed in this encounter.   Patient Instructions  Medication Instructions:  The current medical regimen is effective;  continue present plan and medications.  *If you need a refill on your cardiac medications before your next appointment, please call your pharmacy*  Lab Work: Please have blood work here Thursday 4/8 and then [place] screening as scheduled at the [facility].  If you have labs (blood work) drawn today and your tests are completely normal, you will receive your results only by: Marland Kitchen MyChart Message (if you have MyChart) OR . A paper copy in the mail If you have any lab test that is abnormal or we need to change your treatment, we will call you to review the results.   Testing/Procedures: Your physician has requested that you have a Cardioversion. Electrical Cardioversion uses a jolt of electricity to your heart either through paddles or wired patches attached to your chest. This is a controlled, usually prescheduled, procedure. This procedure is done at the hospital and you are not awake during the  procedure. You usually go home the day of the procedure. Please see the instruction sheet given to you today for more information.  Follow-Up: At [facility], you and your health needs are our priority.  As part of our continuing mission to provide you with exceptional heart care, we have created designated Provider Care Teams.  These Care Teams include your primary Cardiologist (physician) and Advanced Practice Providers (APPs -  Physician Assistants and Nurse Practitioners) who all work together to provide you with the care you need, when you need it.  We recommend signing up for the patient portal called "MyChart".  Sign up information is provided on this After Visit Summary.  MyChart is used to connect with patients for Virtual Visits (Telemedicine).  Patients are able to view lab/test results, encounter notes, upcoming appointments, etc.  Non-urgent messages can be sent to your provider as well.   To learn more about what you can do with MyChart, go to [email or web address].    Your next appointment:   4 week(s)  The format for your next appointment:   In Person  Provider:   You may see Kelly Furbish, MD or one of the following Advanced Practice Providers on your designated Care Team:    Truitt Merle, NP  Cecilie Kicks, NP  Kathyrn Drown, NP    Thank you for choosing [facility]!!        Signed, Kelly Furbish, MD  10/11/2019 11:54 AM    [place]

## 2019-10-11 NOTE — H&P (View-Only) (Signed)
Cardiology Office Note:    Date:  10/11/2019   ID:  Kelly Bates, DOB 1936-06-26, MRN [id number]  PCP:  Lujean Amel, MD  Cardiologist:  Candee Furbish, MD  Electrophysiologist:  None   Referring MD: Lujean Amel, MD     History of Present Illness:    Kelly Bates is a 84 y.o. female here for the follow-up of atrial fibrillation.  She was seen in consultation by Dr. Liam Rogers on 09/05/2019 in the hospital setting.  I saw her last in 2017 for atypical chest pain.  Stress test was low risk without ischemia.  During her hospitalization, she began feeling right arm numbness MRI brain showed acute left thalamus infarct.  EF 55 to 60% on echo.  Mild to moderate mitral regurgitation noted  She has had some intermittent flutters at times no fevers chills nausea vomiting syncope bleeding.  She still feels her atrial fibrillation.  This does not seem to be limiting her too much.  Overall reasonably rate controlled with her atenolol chlorthalidone.  I explained to her that I would like for her to get an opportunity at sinus rhythm at least once.  She has been compliant with her Eliquis.  No bleeding.  Past Medical History:  Diagnosis Date  . Arthritis   . Asthma    pt denies  . Diabetes mellitus   . Diverticulosis   . Gout   . Hypertension   . Osteoporosis   . Stroke [facility])     Past Surgical History:  Procedure Laterality Date  . APPENDECTOMY    . COLONOSCOPY  05-2004    Current Medications: Current Meds  Medication Sig  . acetaminophen (TYLENOL) 500 MG tablet Take 1,000 mg by mouth every 6 (six) hours as needed for mild pain or headache.  . alendronate (FOSAMAX) 70 MG tablet Take 70 mg by mouth once a week.  Marland Kitchen amLODipine (NORVASC) 2.5 MG tablet Take 2.5 mg by mouth daily.   Marland Kitchen apixaban (ELIQUIS) 5 MG TABS tablet Take 1 tablet (5 mg total) by mouth 2 (two) times daily.  Marland Kitchen atenolol-chlorthalidone (TENORETIC) 100-25 MG tablet Take 1 tablet by mouth daily.   Marland Kitchen lovastatin  (MEVACOR) 40 MG tablet Take 40 mg by mouth at bedtime.   . metFORMIN (GLUCOPHAGE) 500 MG tablet Take 500 mg by mouth daily with breakfast.   . Multiple Vitamin (MULTIVITAMIN WITH MINERALS) TABS tablet Take 1 tablet by mouth daily with breakfast.  . polyvinyl alcohol (LIQUIFILM TEARS) 1.4 % ophthalmic solution Place 1 drop into both eyes every 4 (four) hours as needed for dry eyes.  Marland Kitchen psyllium (REGULOID) 0.52 g capsule Take 0.52 g by mouth daily with breakfast.  . traMADol (ULTRAM) 50 MG tablet Take 50 mg by mouth every 12 (twelve) hours as needed for moderate pain.      Allergies:   Lipitor [atorvastatin]   Social History   Socioeconomic History  . Marital status: Divorced    Spouse name: Not on file  . Number of children: Not on file  . Years of education: Not on file  . Highest education level: Not on file  Occupational History  . Not on file  Tobacco Use  . Smoking status: Current Every Day Smoker    Packs/day: 0.50    Types: Cigarettes  . Smokeless tobacco: Never Used  Substance and Sexual Activity  . Alcohol use: Yes    Alcohol/week: 14.0 standard drinks    Types: 14 Glasses of wine per week  .  Drug use: No  . Sexual activity: Not Currently  Other Topics Concern  . Not on file  Social History Narrative  . Not on file   Social Determinants of Health   Financial Resource Strain:   . Difficulty of Paying Living Expenses:   Food Insecurity:   . Worried About Charity fundraiser in the Last Year:   . Arboriculturist in the Last Year:   Transportation Needs:   . Film/video editor (Medical):   Marland Kitchen Lack of Transportation (Non-Medical):   Physical Activity:   . Days of Exercise per Week:   . Minutes of Exercise per Session:   Stress:   . Feeling of Stress :   Social Connections:   . Frequency of Communication with Friends and Family:   . Frequency of Social Gatherings with Friends and Family:   . Attends Religious Services:   . Active Member of Clubs or  Organizations:   . Attends Archivist Meetings:   Marland Kitchen Marital Status:      Family History: The patient's family history includes CVA in her father; Diabetes in her mother; Hypertension in her mother; Lung cancer in her father. There is no history of Colon cancer, Esophageal cancer, Rectal cancer, or Stomach cancer.  ROS:   Please see the history of present illness.     All other systems reviewed and are negative.  EKGs/Labs/Other Studies Reviewed:    The following studies were reviewed today: EF 55%  EKG:  sinus rhythm 67 bpm no other abnormalities on 09/05/2019  Recent Labs: 09/04/2019: ALT 29 09/05/2019: Magnesium 1.9; TSH 1.781 09/06/2019: BUN 6; Creatinine, Ser 0.54; Hemoglobin 11.5; Platelets 187; Potassium 3.4; Sodium 141  Recent Lipid Panel    Component Value Date/Time   CHOL 134 09/05/2019 0247   TRIG 70 09/05/2019 0247   HDL 73 09/05/2019 0247   CHOLHDL 1.8 09/05/2019 0247   VLDL 14 09/05/2019 0247   LDLCALC 47 09/05/2019 0247    Physical Exam:    VS:  BP 120/70   Pulse 69   Ht 5\' 4"  (1.626 m)   Wt 162 lb (73.5 kg)   SpO2 96%   BMI 27.81 kg/m     Wt Readings from Last 3 Encounters:  10/11/19 162 lb (73.5 kg)  09/04/19 160 lb (72.6 kg)  04/27/17 170 lb (77.1 kg)     GEN:  Well nourished, well developed in no acute distress HEENT: Normal NECK: No JVD; No carotid bruits LYMPHATICS: No lymphadenopathy CARDIAC: irreg irreg slft systolic murmurs, rubs, gallops RESPIRATORY:  Clear to auscultation without rales, wheezing or rhonchi  ABDOMEN: Soft, non-tender, non-distended MUSCULOSKELETAL:  No edema; No deformity  SKIN: Warm and dry NEUROLOGIC:  Alert and oriented x 3 PSYCHIATRIC:  Normal affect   ASSESSMENT:    1. Persistent atrial fibrillation ([place])   2. Left thalamic infarction ([place])   3. Moderate mitral regurgitation   4. Pre-procedure lab exam    PLAN:    In order of problems listed above:  Rate controlled atrial  fibrillation -CHA2DS2-VASc 7 Eliquis Echo with normal EF -She has been compliant with her Eliquis.  She is still in atrial fibrillation today on physical exam.  We will go ahead and get her set up for a cardioversion to give her the opportunity for at least sinus rhythm once.  Discussed the risks and benefits.  Willing to proceed.  Left thalamic infarct -Neurology is followed.  Has an appointment upcoming.  She still has  some numbness she states in her right arm but her movement is better.  Mild to moderate mitral regurgitation and tricuspid regurgitation -We will continue to monitor with echocardiogram.   Medication Adjustments/Labs and Tests Ordered: Current medicines are reviewed at length with the patient today.  Concerns regarding medicines are outlined above.  Orders Placed This Encounter  Procedures  . Basic metabolic panel   No orders of the defined types were placed in this encounter.   Patient Instructions  Medication Instructions:  The current medical regimen is effective;  continue present plan and medications.  *If you need a refill on your cardiac medications before your next appointment, please call your pharmacy*  Lab Work: Please have blood work here Thursday 4/8 and then [place] screening as scheduled at the [facility].  If you have labs (blood work) drawn today and your tests are completely normal, you will receive your results only by: Marland Kitchen MyChart Message (if you have MyChart) OR . A paper copy in the mail If you have any lab test that is abnormal or we need to change your treatment, we will call you to review the results.   Testing/Procedures: Your physician has requested that you have a Cardioversion. Electrical Cardioversion uses a jolt of electricity to your heart either through paddles or wired patches attached to your chest. This is a controlled, usually prescheduled, procedure. This procedure is done at the hospital and you are not awake during the  procedure. You usually go home the day of the procedure. Please see the instruction sheet given to you today for more information.  Follow-Up: At [facility], you and your health needs are our priority.  As part of our continuing mission to provide you with exceptional heart care, we have created designated Provider Care Teams.  These Care Teams include your primary Cardiologist (physician) and Advanced Practice Providers (APPs -  Physician Assistants and Nurse Practitioners) who all work together to provide you with the care you need, when you need it.  We recommend signing up for the patient portal called "MyChart".  Sign up information is provided on this After Visit Summary.  MyChart is used to connect with patients for Virtual Visits (Telemedicine).  Patients are able to view lab/test results, encounter notes, upcoming appointments, etc.  Non-urgent messages can be sent to your provider as well.   To learn more about what you can do with MyChart, go to [email or web address].    Your next appointment:   4 week(s)  The format for your next appointment:   In Person  Provider:   You may see Candee Furbish, MD or one of the following Advanced Practice Providers on your designated Care Team:    Truitt Merle, NP  Cecilie Kicks, NP  Kathyrn Drown, NP    Thank you for choosing [facility]!!        Signed, Candee Furbish, MD  10/11/2019 11:54 AM    [place]

## 2019-10-24 ENCOUNTER — Other Ambulatory Visit: Payer: Medicare PPO

## 2019-10-24 ENCOUNTER — Other Ambulatory Visit (HOSPITAL_COMMUNITY)
Admission: RE | Admit: 2019-10-24 | Discharge: 2019-10-24 | Disposition: A | Discharge status: Home / Self Care | Payer: Medicare PPO | Source: Ambulatory Visit | Attending: Cardiology | Admitting: Cardiology

## 2019-10-24 ENCOUNTER — Encounter: Payer: Self-pay | Admitting: Neurology

## 2019-10-24 ENCOUNTER — Other Ambulatory Visit: Payer: Self-pay

## 2019-10-24 ENCOUNTER — Ambulatory Visit: Payer: Medicare PPO | Admitting: Neurology

## 2019-10-24 ENCOUNTER — Other Ambulatory Visit: Payer: Medicare PPO | Admitting: *Deleted

## 2019-10-24 VITALS — BP 115/72 | HR 78 | Temp 97.5°F | Ht 64.0 in | Wt 165.6 lb

## 2019-10-24 DIAGNOSIS — I480 Paroxysmal atrial fibrillation: Secondary | ICD-10-CM | POA: Diagnosis not present

## 2019-10-24 DIAGNOSIS — Z20822 Contact with and (suspected) exposure to covid-19: Secondary | ICD-10-CM | POA: Diagnosis not present

## 2019-10-24 DIAGNOSIS — I639 Cerebral infarction, unspecified: Secondary | ICD-10-CM

## 2019-10-24 DIAGNOSIS — I4819 Other persistent atrial fibrillation: Secondary | ICD-10-CM

## 2019-10-24 DIAGNOSIS — Z01812 Encounter for preprocedural laboratory examination: Secondary | ICD-10-CM | POA: Diagnosis present

## 2019-10-24 DIAGNOSIS — R200 Anesthesia of skin: Secondary | ICD-10-CM | POA: Diagnosis not present

## 2019-10-24 DIAGNOSIS — I6381 Other cerebral infarction due to occlusion or stenosis of small artery: Secondary | ICD-10-CM

## 2019-10-24 LAB — BASIC METABOLIC PANEL
BUN/Creatinine Ratio: 28 (ref 12–28)
BUN: 16 mg/dL (ref 8–27)
CO2: 30 mmol/L — ABNORMAL HIGH (ref 20–29)
Calcium: 10.3 mg/dL (ref 8.7–10.3)
Chloride: 99 mmol/L (ref 96–106)
Creatinine, Ser: 0.57 mg/dL (ref 0.57–1.00)
GFR calc Af Amer: 100 mL/min/{1.73_m2} (ref >59)
GFR calc non Af Amer: 87 mL/min/{1.73_m2} (ref >59)
Glucose: 194 mg/dL — ABNORMAL HIGH (ref 65–99)
Potassium: 3.7 mmol/L (ref 3.5–5.2)
Sodium: 136 mmol/L (ref 134–144)

## 2019-10-24 LAB — SARS CORONAVIRUS 2 (TAT 6-24 HRS): SARS Coronavirus 2: NEGATIVE

## 2019-10-24 NOTE — Progress Notes (Signed)
[facility] [address] [place]. [place] [address] [phone]       OFFICE FOLLOW-UP NOTE  [name] Date of Birth:  01/05/1936 Medical Record Number:  [id number]   HPI: Kelly Bates is a pleasant 84 year old African-American lady seen today for initial office follow-up visit following hospital consultation for stroke in February 2021.  She has past medical history of diabetes, hypertension, hyperlipidemia.  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  Later on that night she noticed her right leg was also numb.  EMS were called who found her blood pressure to be elevated but she refused to be admitted.  Next day symptoms did not resolve and she did have some palpitations which she has had irregular heart beats for several months.  She had an MRI scan of the brain done which showed subcentimeter left thalamic infarct.  CT angiogram showed some diminished branches in the left posterior cerebral artery.  Echocardiogram showed normal ejection fraction.  Cardiac monitoring showed atrial fibrillation which was of new onset.  LDL cholesterol was 47 mg percent.  Hemoglobin A1c was 6.7.  She was started on Eliquis for stroke prevention.  She states she is done well since discharge numbness is improving but she still has some numbness in the right arm as well as some clumsiness.  She gets tired more easily.  She plans on having elective cardioversion next week.  She is tolerating Eliquis well without bruising or bleeding.  Her blood pressure is well controlled today it is 117/72.  She is tolerating Mevacor well without muscle aches and pains.  She has no new complaints today.  ROS:   14 system review of systems is positive for numbness, tingling, tiredness and all other systems negative  PMH:  Past Medical History:  Diagnosis Date  . Arthritis   . Asthma    pt denies  . Asthma   .  Diabetes mellitus   . Diverticulosis   . Gout   . Hypertension   . Osteoporosis   . Stroke [facility])     Social History:  Social History   Socioeconomic History  . Marital status: Divorced    Spouse name: Not on file  . Number of children: Not on file  . Years of education: Not on file  . Highest education level: Not on file  Occupational History  . Not on file  Tobacco Use  . Smoking status: Current Every Day Smoker    Packs/day: 0.50    Types: Cigarettes  . Smokeless tobacco: Never Used  Substance and Sexual Activity  . Alcohol use: Yes    Alcohol/week: 14.0 standard drinks    Types: 14 Glasses of wine per week  . Drug use: No  . Sexual activity: Not Currently  Other Topics Concern  . Not on file  Social History Narrative  . Not on file   Social Determinants of Health   Financial Resource Strain:   . Difficulty of Paying Living Expenses:   Food Insecurity:   . Worried About Charity fundraiser in the Last Year:   . Arboriculturist in the Last Year:   Transportation Needs:   . Film/video editor (Medical):   Marland Kitchen Lack of Transportation (Non-Medical):   Physical Activity:   . Days of Exercise per Week:   . Minutes of Exercise per Session:   Stress:   .  Feeling of Stress :   Social Connections:   . Frequency of Communication with Friends and Family:   . Frequency of Social Gatherings with Friends and Family:   . Attends Religious Services:   . Active Member of Clubs or Organizations:   . Attends Archivist Meetings:   Marland Kitchen Marital Status:   Intimate Partner Violence:   . Fear of Current or Ex-Partner:   . Emotionally Abused:   Marland Kitchen Physically Abused:   . Sexually Abused:     Medications:   Current Outpatient Medications on File Prior to Visit  Medication Sig Dispense Refill  . acetaminophen (TYLENOL) 500 MG tablet Take 1,000 mg by mouth every 6 (six) hours as needed for mild pain or headache.    Marland Kitchen amLODipine (NORVASC) 2.5 MG tablet Take 2.5 mg by  mouth daily.     Marland Kitchen apixaban (ELIQUIS) 5 MG TABS tablet Take 1 tablet (5 mg total) by mouth 2 (two) times daily. 60 tablet 1  . atenolol-chlorthalidone (TENORETIC) 100-25 MG tablet Take 1 tablet by mouth daily.     Marland Kitchen lovastatin (MEVACOR) 40 MG tablet Take 40 mg by mouth at bedtime.     . metFORMIN (GLUCOPHAGE) 500 MG tablet Take 500 mg by mouth daily.     . Multiple Vitamin (MULTIVITAMIN WITH MINERALS) TABS tablet Take 1 tablet by mouth daily with breakfast.    . polyethylene glycol powder (GLYCOLAX/MIRALAX) 17 GM/SCOOP powder Take 1 Container by mouth 3 (three) times a week.    . polyvinyl alcohol (LIQUIFILM TEARS) 1.4 % ophthalmic solution Place 1 drop into both eyes every 4 (four) hours as needed for dry eyes.    . traMADol (ULTRAM) 50 MG tablet Take 50 mg by mouth 2 (two) times daily as needed for moderate pain.      No current facility-administered medications on file prior to visit.    Allergies:   Allergies  Allergen Reactions  . Lipitor [Atorvastatin]     Bloating    Physical Exam General: well developed, well nourished elderly African-American lady, seated, in no evident distress Head: head normocephalic and atraumatic.  Neck: supple with no carotid or supraclavicular bruits Cardiovascular: regular rate and rhythm, no murmurs Musculoskeletal: no deformity Skin:  no rash/petichiae Vascular:  Normal pulses all extremities Vitals:   10/24/19 1334  BP: 115/72  Pulse: 78  Temp: (!) 97.5 F (36.4 C)   Neurologic Exam Mental Status: Awake and fully alert. Oriented to place and time. Recent and remote memory intact. Attention span, concentration and fund of knowledge appropriate. Mood and affect appropriate.  Cranial Nerves: Fundoscopic exam reveals sharp disc margins. Pupils equal, briskly reactive to light. Extraocular movements full without nystagmus. Visual fields full to confrontation. Hearing intact. Facial sensation intact. Face, tongue, palate moves normally and  symmetrically.  Motor: Normal bulk and tone. Normal strength in all tested extremity muscles.  Diminished fine finger movements on the right.  Orbits left over right upper extremity. Sensory.:  Diminished right upper extremity touch and pinprick sensation but intact.position and vibratory sensation.  Coordination: Rapid alternating movements normal in all extremities. Finger-to-nose and heel-to-shin performed accurately bilaterally. Gait and Station: Arises from chair without difficulty. Stance is normal. Gait demonstrates normal stride length and balance . Able to heel, toe and tandem walk with mild  difficulty.  Reflexes: 1+ and symmetric. Toes downgoing.   NIHSS  1 Modified Rankin  2  ASSESSMENT: 84 year old African-American lady with left thalamic infarct in February 2021 likely secondary to atrial  fibrillation.  Vascular risk factors of hypertension, hyperlipidemia, age and atrial fibrillation     PLAN: I had a long d/w patient about her recent thalamic stroke,new daignosis of atrial fibrillation, risk for recurrent stroke/TIAs, personally independently reviewed imaging studies and stroke evaluation results and answered questions.Continue Eliquis (apixaban) daily  for secondary stroke prevention and maintain strict control of hypertension with blood pressure goal below 130/90, diabetes with hemoglobin A1c goal below 6.5% and lipids with LDL cholesterol goal below 70 mg/dL. I also advised the patient to eat a healthy diet with plenty of whole grains, cereals, fruits and vegetables, exercise regularly and maintain ideal body weight.  I have counseled the patient to quit smoking completely and she is agreeing.  I expect her post stroke paresthesias to gradually improve over the next few months.  If these get worse I advised her to call me to try medications like gabapentin or Topamax.  She will keep her appointment with cardiology for schedule cardioversion next week.  Followup in the future with  my nurse practitioner Janett Billow in 3 months or call earlier if necessary. Greater than 50% of time during this 30 minute visit was spent on counseling,explanation of diagnosis, planning of further management, discussion with patient and family and coordination of care Antony Contras, MD  [facility] [address] [place], [place] [address]  Phone [phone] Fax [phone] Note: This document was prepared with digital dictation and possible smart phrase technology. Any transcriptional errors that result from this process are unintentional

## 2019-10-24 NOTE — Patient Instructions (Signed)
I had a long d/w patient about her recent thalamic stroke,new daignosis of atrial fibrillation, risk for recurrent stroke/TIAs, personally independently reviewed imaging studies and stroke evaluation results and answered questions.Continue Eliquis (apixaban) daily  for secondary stroke prevention and maintain strict control of hypertension with blood pressure goal below 130/90, diabetes with hemoglobin A1c goal below 6.5% and lipids with LDL cholesterol goal below 70 mg/dL. I also advised the patient to eat a healthy diet with plenty of whole grains, cereals, fruits and vegetables, exercise regularly and maintain ideal body weight.  I have counseled the patient to quit smoking completely and she is agreeing.  I expect her post stroke paresthesias to gradually improve over the next few months.  If these get worse I advised her to call me to try medications like gabapentin or Topamax.  She will keep her appointment with cardiology for schedule cardioversion next week.  Followup in the future with my nurse practitioner Janett Billow in 3 months or call earlier if necessary.  Stroke Prevention Some medical conditions and behaviors are associated with a higher chance of having a stroke. You can help prevent a stroke by making nutrition, lifestyle, and other changes, including managing any medical conditions you may have. What nutrition changes can be made?   Eat healthy foods. You can do this by: ? Choosing foods high in fiber, such as fresh fruits and vegetables and whole grains. ? Eating at least 5 or more servings of fruits and vegetables a day. Try to fill half of your plate at each meal with fruits and vegetables. ? Choosing lean protein foods, such as lean cuts of meat, poultry without skin, fish, tofu, beans, and nuts. ? Eating low-fat dairy products. ? Avoiding foods that are high in salt (sodium). This can help lower blood pressure. ? Avoiding foods that have saturated fat, trans fat, and cholesterol.  This can help prevent high cholesterol. ? Avoiding processed and premade foods.  Follow your health care provider's specific guidelines for losing weight, controlling high blood pressure (hypertension), lowering high cholesterol, and managing diabetes. These may include: ? Reducing your daily calorie intake. ? Limiting your daily sodium intake to 1,500 milligrams (mg). ? Using only healthy fats for cooking, such as olive oil, canola oil, or sunflower oil. ? Counting your daily carbohydrate intake. What lifestyle changes can be made?  Maintain a healthy weight. Talk to your health care provider about your ideal weight.  Get at least 30 minutes of moderate physical activity at least 5 days a week. Moderate activity includes brisk walking, biking, and swimming.  Do not use any products that contain nicotine or tobacco, such as cigarettes and e-cigarettes. If you need help quitting, ask your health care provider. It may also be helpful to avoid exposure to secondhand smoke.  Limit alcohol intake to no more than 1 drink a day for nonpregnant women and 2 drinks a day for men. One drink equals 12 oz of beer, 5 oz of wine, or 1 oz of hard liquor.  Stop any illegal drug use.  Avoid taking birth control pills. Talk to your health care provider about the risks of taking birth control pills if: ? You are over 27 years old. ? You smoke. ? You get migraines. ? You have ever had a blood clot. What other changes can be made?  Manage your cholesterol levels. ? Eating a healthy diet is important for preventing high cholesterol. If cholesterol cannot be managed through diet alone, you may also need to  take medicines. ? Take any prescribed medicines to control your cholesterol as told by your health care provider.  Manage your diabetes. ? Eating a healthy diet and exercising regularly are important parts of managing your blood sugar. If your blood sugar cannot be managed through diet and exercise, you  may need to take medicines. ? Take any prescribed medicines to control your diabetes as told by your health care provider.  Control your hypertension. ? To reduce your risk of stroke, try to keep your blood pressure below 130/80. ? Eating a healthy diet and exercising regularly are an important part of controlling your blood pressure. If your blood pressure cannot be managed through diet and exercise, you may need to take medicines. ? Take any prescribed medicines to control hypertension as told by your health care provider. ? Ask your health care provider if you should monitor your blood pressure at home. ? Have your blood pressure checked every year, even if your blood pressure is normal. Blood pressure increases with age and some medical conditions.  Get evaluated for sleep disorders (sleep apnea). Talk to your health care provider about getting a sleep evaluation if you snore a lot or have excessive sleepiness.  Take over-the-counter and prescription medicines only as told by your health care provider. Aspirin or blood thinners (antiplatelets or anticoagulants) may be recommended to reduce your risk of forming blood clots that can lead to stroke.  Make sure that any other medical conditions you have, such as atrial fibrillation or atherosclerosis, are managed. What are the warning signs of a stroke? The warning signs of a stroke can be easily remembered as BEFAST.  B is for balance. Signs include: ? Dizziness. ? Loss of balance or coordination. ? Sudden trouble walking.  E is for eyes. Signs include: ? A sudden change in vision. ? Trouble seeing.  F is for face. Signs include: ? Sudden weakness or numbness of the face. ? The face or eyelid drooping to one side.  A is for arms. Signs include: ? Sudden weakness or numbness of the arm, usually on one side of the body.  S is for speech. Signs include: ? Trouble speaking (aphasia). ? Trouble understanding.  T is for  time. ? These symptoms may represent a serious problem that is an emergency. Do not wait to see if the symptoms will go away. Get medical help right away. Call your local emergency services (911 in the [place] Do not drive yourself to the hospital.  Other signs of stroke may include: ? A sudden, severe headache with no known cause. ? Nausea or vomiting. ? Seizure. Where to find more information For more information, visit:  American Stroke Association: www.strokeassociation.org  National Stroke Association: www.stroke.org Summary  You can prevent a stroke by eating healthy, exercising, not smoking, limiting alcohol intake, and managing any medical conditions you may have.  Do not use any products that contain nicotine or tobacco, such as cigarettes and e-cigarettes. If you need help quitting, ask your health care provider. It may also be helpful to avoid exposure to secondhand smoke.  Remember BEFAST for warning signs of stroke. Get help right away if you or a loved one has any of these signs. This information is not intended to replace advice given to you by your health care provider. Make sure you discuss any questions you have with your health care provider. Document Revised: 06/16/2017 Document Reviewed: 08/09/2016 Elsevier Patient Education  2020 [employer].

## 2019-10-28 ENCOUNTER — Encounter (HOSPITAL_COMMUNITY)
Admission: RE | Disposition: A | Discharge status: Home / Self Care | Payer: Medicare PPO | Source: Home / Self Care | Attending: Cardiology

## 2019-10-28 ENCOUNTER — Other Ambulatory Visit: Payer: Self-pay

## 2019-10-28 ENCOUNTER — Ambulatory Visit (HOSPITAL_COMMUNITY): Payer: Medicare PPO | Admitting: Certified Registered Nurse Anesthetist

## 2019-10-28 ENCOUNTER — Ambulatory Visit (HOSPITAL_COMMUNITY)
Admission: RE | Admit: 2019-10-28 | Discharge: 2019-10-28 | Disposition: A | Discharge status: Home / Self Care | Payer: Medicare PPO | Attending: Cardiology | Admitting: Cardiology

## 2019-10-28 DIAGNOSIS — Z7983 Long term (current) use of bisphosphonates: Secondary | ICD-10-CM | POA: Diagnosis not present

## 2019-10-28 DIAGNOSIS — F17210 Nicotine dependence, cigarettes, uncomplicated: Secondary | ICD-10-CM | POA: Insufficient documentation

## 2019-10-28 DIAGNOSIS — Z8249 Family history of ischemic heart disease and other diseases of the circulatory system: Secondary | ICD-10-CM | POA: Insufficient documentation

## 2019-10-28 DIAGNOSIS — E119 Type 2 diabetes mellitus without complications: Secondary | ICD-10-CM | POA: Insufficient documentation

## 2019-10-28 DIAGNOSIS — Z8673 Personal history of transient ischemic attack (TIA), and cerebral infarction without residual deficits: Secondary | ICD-10-CM | POA: Insufficient documentation

## 2019-10-28 DIAGNOSIS — Z833 Family history of diabetes mellitus: Secondary | ICD-10-CM | POA: Diagnosis not present

## 2019-10-28 DIAGNOSIS — Z7901 Long term (current) use of anticoagulants: Secondary | ICD-10-CM | POA: Insufficient documentation

## 2019-10-28 DIAGNOSIS — M109 Gout, unspecified: Secondary | ICD-10-CM | POA: Diagnosis not present

## 2019-10-28 DIAGNOSIS — I4819 Other persistent atrial fibrillation: Secondary | ICD-10-CM | POA: Insufficient documentation

## 2019-10-28 DIAGNOSIS — Z823 Family history of stroke: Secondary | ICD-10-CM | POA: Insufficient documentation

## 2019-10-28 DIAGNOSIS — Z79899 Other long term (current) drug therapy: Secondary | ICD-10-CM | POA: Diagnosis not present

## 2019-10-28 DIAGNOSIS — Z7984 Long term (current) use of oral hypoglycemic drugs: Secondary | ICD-10-CM | POA: Diagnosis not present

## 2019-10-28 DIAGNOSIS — M1990 Unspecified osteoarthritis, unspecified site: Secondary | ICD-10-CM | POA: Insufficient documentation

## 2019-10-28 DIAGNOSIS — I10 Essential (primary) hypertension: Secondary | ICD-10-CM | POA: Diagnosis not present

## 2019-10-28 DIAGNOSIS — Z888 Allergy status to other drugs, medicaments and biological substances status: Secondary | ICD-10-CM | POA: Diagnosis not present

## 2019-10-28 DIAGNOSIS — I081 Rheumatic disorders of both mitral and tricuspid valves: Secondary | ICD-10-CM | POA: Insufficient documentation

## 2019-10-28 HISTORY — PX: CARDIOVERSION: SHX1299

## 2019-10-28 SURGERY — CARDIOVERSION
Anesthesia: General

## 2019-10-28 MED ORDER — LIDOCAINE 2% (20 MG/ML) 5 ML SYRINGE
INTRAMUSCULAR | Status: DC | PRN
  Administered 2019-10-28: 40 mg via INTRAVENOUS

## 2019-10-28 MED ORDER — SODIUM CHLORIDE 0.9 % IV SOLN: INTRAVENOUS | Status: DC | PRN

## 2019-10-28 MED ORDER — PROPOFOL 10 MG/ML IV BOLUS
INTRAVENOUS | Status: DC | PRN
  Administered 2019-10-28: 80 mg via INTRAVENOUS

## 2019-10-28 NOTE — Transfer of Care (Signed)
Immediate Anesthesia Transfer of Care Note  Patient: Kelly Bates  Procedure(s) Performed: CARDIOVERSION (N/A )  Patient Location: Endoscopy Unit  Anesthesia Type:General  Level of Consciousness: awake  Airway & Oxygen Therapy: Patient Spontanous Breathing  Post-op Assessment: Report given to RN and Post -op Vital signs reviewed and stable  Post vital signs: Reviewed  Last Vitals:  Vitals Value Taken Time  BP    Temp    Pulse    Resp    SpO2      Last Pain:  Vitals:   10/28/19 0921  TempSrc: Oral  PainSc: 0-No pain         Complications: No apparent anesthesia complications

## 2019-10-28 NOTE — Anesthesia Postprocedure Evaluation (Signed)
Anesthesia Post Note  Patient: Kelly Bates  Procedure(s) Performed: CARDIOVERSION (N/A )     Patient location during evaluation: Endoscopy Anesthesia Type: General Level of consciousness: awake and alert, patient cooperative and oriented Pain management: pain level controlled Vital Signs Assessment: post-procedure vital signs reviewed and stable Respiratory status: spontaneous breathing, nonlabored ventilation and respiratory function stable Cardiovascular status: blood pressure returned to baseline and stable Postop Assessment: no apparent nausea or vomiting Anesthetic complications: no    Last Vitals:  Vitals:   10/28/19 1042 10/28/19 1052  BP: (!) 107/50 (!) 103/59  Pulse: 72 66  Resp: 18 15  Temp: (!) 36.3 C   SpO2: 100% 94%    Last Pain:  Vitals:   10/28/19 1052  TempSrc:   PainSc: 0-No pain                 Yussef Jorge,E. Irish Piech

## 2019-10-28 NOTE — Anesthesia Preprocedure Evaluation (Signed)
Anesthesia Evaluation  Patient identified by MRN, date of birth, ID band Patient awake    Reviewed: Allergy & Precautions, NPO status , Patient's Chart, lab work & pertinent test results  Airway Mallampati: II  TM Distance: >3 FB Neck ROM: Full    Dental  (+) Edentulous Upper, Poor Dentition, Dental Advisory Given, Chipped   Pulmonary Current Smoker and Patient abstained from smoking.,  10/24/2019 SARS coronavirus NEG   breath sounds clear to auscultation       Cardiovascular hypertension, Pt. on medications and Pt. on home beta blockers  Rhythm:Irregular Rate:Normal  08/2019 ECHO: EF 55-60%, mild-mod MR, mild-mod TR   Neuro/Psych CVA    GI/Hepatic negative GI ROS, Neg liver ROS,   Endo/Other  diabetes, Oral Hypoglycemic Agents  Renal/GU negative Renal ROS     Musculoskeletal   Abdominal   Peds  Hematology eliquis   Anesthesia Other Findings   Reproductive/Obstetrics                             Anesthesia Physical Anesthesia Plan  ASA: III  Anesthesia Plan: General   Post-op Pain Management:    Induction: Intravenous  PONV Risk Score and Plan: 2 and Treatment may vary due to age or medical condition  Airway Management Planned: Natural Airway and Mask  Additional Equipment:   Intra-op Plan:   Post-operative Plan:   Informed Consent: I have reviewed the patients History and Physical, chart, labs and discussed the procedure including the risks, benefits and alternatives for the proposed anesthesia with the patient or authorized representative who has indicated his/her understanding and acceptance.     Dental advisory given  Plan Discussed with: CRNA and Surgeon  Anesthesia Plan Comments:         Anesthesia Quick Evaluation

## 2019-10-28 NOTE — CV Procedure (Signed)
    Electrical Cardioversion Procedure Note KINESHA DROZD [id number] July 11, 1936  Procedure: Electrical Cardioversion Indications:  Atrial Fibrillation  Time Out: Verified patient identification, verified procedure,medications/allergies/relevent history reviewed, required imaging and test results available.  Performed  Procedure Details  The patient was NPO after midnight. Anesthesia was administered at the beside  by Dr.Jackson with propofol.  Cardioversion was performed with synchronized biphasic defibrillation via AP pads with 120, 200 joules.  2 attempt(s) were performed.  The patient converted to normal sinus rhythm. The patient tolerated the procedure well   IMPRESSION:  Successful cardioversion of atrial fibrillation after second shock. PVC's noted post conversion.     Candee Furbish 10/28/2019, 10:40 AM

## 2019-10-28 NOTE — Interval H&P Note (Signed)
History and Physical Interval Note:  10/28/2019 9:56 AM  Kelly Bates  has presented today for surgery, with the diagnosis of A-FIB.  The various methods of treatment have been discussed with the patient and family. After consideration of risks, benefits and other options for treatment, the patient has consented to  Procedure(s): CARDIOVERSION (N/A) as a surgical intervention.  The patient's history has been reviewed, patient examined, no change in status, stable for surgery.  I have reviewed the patient's chart and labs.  Questions were answered to the patient's satisfaction.     [employer]

## 2019-10-28 NOTE — Anesthesia Procedure Notes (Signed)
Procedure Name: General with mask airway Date/Time: 10/28/2019 10:34 AM Performed by: Janene Harvey, CRNA Pre-anesthesia Checklist: Patient identified, Emergency Drugs available, Suction available and Timeout performed Patient Re-evaluated:Patient Re-evaluated prior to induction Oxygen Delivery Method: Ambu bag Placement Confirmation: positive ETCO2 Dental Injury: Teeth and Oropharynx as per pre-operative assessment

## 2019-11-08 ENCOUNTER — Other Ambulatory Visit: Payer: Self-pay

## 2019-11-08 MED ORDER — APIXABAN 5 MG PO TABS: 5.0000 mg | ORAL_TABLET | Freq: Two times a day (BID) | ORAL | 5 refills | Status: DC

## 2019-11-08 NOTE — Telephone Encounter (Signed)
Prescription refill request for Eliquis received.  Last office visit: 10/11/2019, Skains Scr: 0.57, 10/24/2019 Age: 84 y.o. Weight: 79.4 kg   Prescription refill sent.

## 2019-11-25 ENCOUNTER — Other Ambulatory Visit: Payer: Self-pay

## 2019-11-25 ENCOUNTER — Ambulatory Visit: Payer: Medicare PPO | Admitting: Cardiology

## 2019-11-25 ENCOUNTER — Encounter: Payer: Self-pay | Admitting: Cardiology

## 2019-11-25 VITALS — BP 100/60 | HR 76 | Ht 64.0 in | Wt 160.8 lb

## 2019-11-25 DIAGNOSIS — I340 Nonrheumatic mitral (valve) insufficiency: Secondary | ICD-10-CM

## 2019-11-25 DIAGNOSIS — I639 Cerebral infarction, unspecified: Secondary | ICD-10-CM | POA: Diagnosis not present

## 2019-11-25 DIAGNOSIS — I4819 Other persistent atrial fibrillation: Secondary | ICD-10-CM

## 2019-11-25 DIAGNOSIS — I6381 Other cerebral infarction due to occlusion or stenosis of small artery: Secondary | ICD-10-CM

## 2019-11-25 NOTE — Progress Notes (Signed)
Cardiology Office Note:    Date:  11/25/2019   ID:  Kelly Bates, DOB [birth year], MRN [id number]  PCP:  Kelly Amel, MD  Cardiologist:  Candee Furbish, MD  Electrophysiologist:  None   Referring MD: Kelly Amel, MD     History of Present Illness:    Kelly Bates is a 84 y.o. female here for follow-up of paroxysmal atrial fibrillation.  Cardioversion-10/28/2019-2 shocks.  PVCs noted postconversion.  Unfortunately today 11/25/2019 her EKG shows atrial fibrillation well rate controlled 76 bpm.  Back in 2017 had a stress test low risk no ischemia.  Previously she was symptomatic with her atrial fibrillation feeling of a flutters.  Her EF was normal on echo with mild to moderate mitral regurgitation.  Prior stroke showed left thalamus infarct with manifest station of right arm numbness.  Well rate controlled with her A. fib.  She has been compliant with Eliquis without any bleeding issues.  She really is not having any terrible symptoms at this point.  She feels well.  Occasionally may feel a flutter-like sensation as was noted above.  We did talk a little bit about her diet.  I asked her to watch how frequently she may get chicken livers at [employer].  Last LDL 47.  Creatinine 0.5 hemoglobin 14.5 TSH 1.7.  Past Medical History:  Diagnosis Date  . Arthritis   . Asthma    pt denies  . Asthma   . Diabetes mellitus   . Diverticulosis   . Gout   . Hypertension   . Osteoporosis   . Stroke [facility])     Past Surgical History:  Procedure Laterality Date  . APPENDECTOMY    . CARDIOVERSION N/A 10/28/2019   Procedure: CARDIOVERSION;  Surgeon: Jerline Pain, MD;  Location: [facility] ENDOSCOPY;  Service: Cardiovascular;  Laterality: N/A;  . COLONOSCOPY  05-2004    Current Medications: Current Meds  Medication Sig  . acetaminophen (TYLENOL) 500 MG tablet Take 1,000 mg by mouth every 6 (six) hours as needed for mild pain or headache.  Marland Kitchen amLODipine (NORVASC) 2.5 MG tablet  Take 2.5 mg by mouth daily.   Marland Kitchen apixaban (ELIQUIS) 5 MG TABS tablet Take 1 tablet (5 mg total) by mouth 2 (two) times daily.  Marland Kitchen atenolol-chlorthalidone (TENORETIC) 100-25 MG tablet Take 1 tablet by mouth daily.   Marland Kitchen lovastatin (MEVACOR) 40 MG tablet Take 40 mg by mouth at bedtime.   . metFORMIN (GLUCOPHAGE) 500 MG tablet Take 500 mg by mouth daily.   . Multiple Vitamin (MULTIVITAMIN WITH MINERALS) TABS tablet Take 1 tablet by mouth daily with breakfast.  . polyethylene glycol powder (GLYCOLAX/MIRALAX) 17 GM/SCOOP powder Take 1 Container by mouth 3 (three) times a week.  . polyvinyl alcohol (LIQUIFILM TEARS) 1.4 % ophthalmic solution Place 1 drop into both eyes every 4 (four) hours as needed for dry eyes.  . traMADol (ULTRAM) 50 MG tablet Take 50 mg by mouth 2 (two) times daily as needed for moderate pain.      Allergies:   Lipitor [atorvastatin]   Social History   Socioeconomic History  . Marital status: Divorced    Spouse name: Not on file  . Number of children: Not on file  . Years of education: Not on file  . Highest education level: Not on file  Occupational History  . Not on file  Tobacco Use  . Smoking status: Current Every Day Smoker    Packs/day: 0.50    Types: Cigarettes  . Smokeless  tobacco: Never Used  Substance and Sexual Activity  . Alcohol use: Yes    Alcohol/week: 14.0 standard drinks    Types: 14 Glasses of wine per week  . Drug use: No  . Sexual activity: Not Currently  Other Topics Concern  . Not on file  Social History Narrative  . Not on file   Social Determinants of Health   Financial Resource Strain:   . Difficulty of Paying Living Expenses:   Food Insecurity:   . Worried About Charity fundraiser in the Last Year:   . Arboriculturist in the Last Year:   Transportation Needs:   . Film/video editor (Medical):   Marland Kitchen Lack of Transportation (Non-Medical):   Physical Activity:   . Days of Exercise per Week:   . Minutes of Exercise per Session:    Stress:   . Feeling of Stress :   Social Connections:   . Frequency of Communication with Friends and Family:   . Frequency of Social Gatherings with Friends and Family:   . Attends Religious Services:   . Active Member of Clubs or Organizations:   . Attends Archivist Meetings:   Marland Kitchen Marital Status:      Family History: The patient's family history includes CVA in her father; Diabetes in her mother; Hypertension in her mother; Lung cancer in her father. There is no history of Colon cancer, Esophageal cancer, Rectal cancer, or Stomach cancer.  ROS:   Please see the history of present illness.     All other systems reviewed and are negative.  EKGs/Labs/Other Studies Reviewed:    The following studies were reviewed today: As above  EKG:  EKG is  ordered today.  The ekg ordered today demonstrates atrial fibrillation rate controlled 76 bpm  Recent Labs: 09/04/2019: ALT 29 09/05/2019: Magnesium 1.9; TSH 1.781 09/06/2019: Hemoglobin 11.5; Platelets 187 10/24/2019: BUN 16; Creatinine, Ser 0.57; Potassium 3.7; Sodium 136  Recent Lipid Panel    Component Value Date/Time   CHOL 134 09/05/2019 0247   TRIG 70 09/05/2019 0247   HDL 73 09/05/2019 0247   CHOLHDL 1.8 09/05/2019 0247   VLDL 14 09/05/2019 0247   LDLCALC 47 09/05/2019 0247    Physical Exam:    VS:  BP 100/60   Pulse 76   Ht 5\' 4"  (1.626 m)   Wt 160 lb 12.8 oz (72.9 kg)   SpO2 97%   BMI 27.60 kg/m     Wt Readings from Last 3 Encounters:  11/25/19 160 lb 12.8 oz (72.9 kg)  10/28/19 175 lb (79.4 kg)  10/24/19 165 lb 9.6 oz (75.1 kg)     GEN:  Well nourished, well developed in no acute distress HEENT: Normal NECK: No JVD; No carotid bruits LYMPHATICS: No lymphadenopathy CARDIAC: irreg no murmurs, rubs, gallops RESPIRATORY:  Clear to auscultation without rales, wheezing or rhonchi  ABDOMEN: Soft, non-tender, non-distended MUSCULOSKELETAL:  No edema; No deformity  SKIN: Warm and dry NEUROLOGIC:  Alert and  oriented x 3 PSYCHIATRIC:  Normal affect   ASSESSMENT:    1. Persistent atrial fibrillation ([place])   2. Left thalamic infarction ([place])   3. Moderate mitral regurgitation    PLAN:    In order of problems listed above:  Rate controlled atrial fibrillation persistent -We will continue with rate control strategy.  No changes made.  Cardioverted successfully however she is back in A. fib now.  Relatively asymptomatic most of the time.  Left thalamic infarct -Some occasional  numbness in right arm.  Movement continues to improve.  Mild to moderate mitral regurgitation and tricuspid regurgitation -Periodically monitor.  Overall she is doing well.   Medication Adjustments/Labs and Tests Ordered: Current medicines are reviewed at length with the patient today.  Concerns regarding medicines are outlined above.  Orders Placed This Encounter  Procedures  . EKG 12-Lead   No orders of the defined types were placed in this encounter.   Patient Instructions  Medication Instructions:  The current medical regimen is effective;  continue present plan and medications.  *If you need a refill on your cardiac medications before your next appointment, please call your pharmacy*  Follow-Up: At [facility], you and your health needs are our priority.  As part of our continuing mission to provide you with exceptional heart care, we have created designated Provider Care Teams.  These Care Teams include your primary Cardiologist (physician) and Advanced Practice Providers (APPs -  Physician Assistants and Nurse Practitioners) who all work together to provide you with the care you need, when you need it.  We recommend signing up for the patient portal called "MyChart".  Sign up information is provided on this After Visit Summary.  MyChart is used to connect with patients for Virtual Visits (Telemedicine).  Patients are able to view lab/test results, encounter notes, upcoming appointments, etc.   Non-urgent messages can be sent to your provider as well.   To learn more about what you can do with MyChart, go to [email or web address].    Your next appointment:   6 month(s)  The format for your next appointment:   In Person  Provider:   Truitt Merle, NP and 1 year with Dr Marlou Porch.   Thank you for choosing [facility]!!        Signed, Candee Furbish, MD  11/25/2019 10:28 AM    [place]

## 2019-11-25 NOTE — Patient Instructions (Signed)
Medication Instructions:  The current medical regimen is effective;  continue present plan and medications.  *If you need a refill on your cardiac medications before your next appointment, please call your pharmacy*  Follow-Up: At [facility], you and your health needs are our priority.  As part of our continuing mission to provide you with exceptional heart care, we have created designated Provider Care Teams.  These Care Teams include your primary Cardiologist (physician) and Advanced Practice Providers (APPs -  Physician Assistants and Nurse Practitioners) who all work together to provide you with the care you need, when you need it.  We recommend signing up for the patient portal called "MyChart".  Sign up information is provided on this After Visit Summary.  MyChart is used to connect with patients for Virtual Visits (Telemedicine).  Patients are able to view lab/test results, encounter notes, upcoming appointments, etc.  Non-urgent messages can be sent to your provider as well.   To learn more about what you can do with MyChart, go to [email or web address].    Your next appointment:   6 month(s)  The format for your next appointment:   In Person  Provider:   Lori Gerhardt, NP and 1 year with Dr Skains.   Thank you for choosing [facility]!!     

## 2019-12-03 ENCOUNTER — Ambulatory Visit: Payer: Medicare PPO | Admitting: Podiatry

## 2019-12-03 ENCOUNTER — Encounter: Payer: Self-pay | Admitting: Podiatry

## 2019-12-03 ENCOUNTER — Other Ambulatory Visit: Payer: Self-pay

## 2019-12-03 VITALS — Temp 97.6°F

## 2019-12-03 DIAGNOSIS — M79675 Pain in left toe(s): Secondary | ICD-10-CM | POA: Diagnosis not present

## 2019-12-03 DIAGNOSIS — M79674 Pain in right toe(s): Secondary | ICD-10-CM | POA: Diagnosis not present

## 2019-12-03 DIAGNOSIS — D689 Coagulation defect, unspecified: Secondary | ICD-10-CM | POA: Insufficient documentation

## 2019-12-03 DIAGNOSIS — E119 Type 2 diabetes mellitus without complications: Secondary | ICD-10-CM

## 2019-12-03 DIAGNOSIS — B351 Tinea unguium: Secondary | ICD-10-CM

## 2019-12-03 NOTE — Progress Notes (Signed)
This patient returns to my office for at risk foot care.  This patient requires this care by a professional since this patient will be at risk due to having coagulation defect and diabetes.  Patient is taking eliquis. This patient is unable to cut nails himself since the patient cannot reach his nails.These nails are painful walking and wearing shoes.  This patient presents for at risk foot care today.  General Appearance  Alert, conversant and in no acute stress.  Vascular  Dorsalis pedis and posterior tibial  pulses are palpable  bilaterally.  Capillary return is within normal limits  bilaterally. Temperature is within normal limits  bilaterally.  Neurologic  Senn-Weinstein monofilament wire test within normal limits  bilaterally. Muscle power within normal limits bilaterally.  Nails Thick disfigured discolored nails with subungual debris  from hallux to fifth toes bilaterally. No evidence of bacterial infection or drainage bilaterally.  Orthopedic  No limitations of motion  feet .  No crepitus or effusions noted.  No bony pathology or digital deformities noted.  Skin  normotropic skin with no porokeratosis noted bilaterally.  No signs of infections or ulcers noted.     Onychomycosis  Pain in right toes  Pain in left toes  Consent was obtained for treatment procedures.   Mechanical debridement of nails 1-5  bilaterally performed with a nail nipper.  Filed with dremel without incident.    Return office visit    3 months                  Told patient to return for periodic foot care and evaluation due to potential at risk complications.   Gardiner Barefoot DPM

## 2019-12-11 DIAGNOSIS — I10 Essential (primary) hypertension: Secondary | ICD-10-CM | POA: Diagnosis not present

## 2019-12-11 DIAGNOSIS — Z7984 Long term (current) use of oral hypoglycemic drugs: Secondary | ICD-10-CM | POA: Diagnosis not present

## 2019-12-11 DIAGNOSIS — E041 Nontoxic single thyroid nodule: Secondary | ICD-10-CM | POA: Diagnosis not present

## 2019-12-11 DIAGNOSIS — E1169 Type 2 diabetes mellitus with other specified complication: Secondary | ICD-10-CM | POA: Diagnosis not present

## 2019-12-11 DIAGNOSIS — Z79899 Other long term (current) drug therapy: Secondary | ICD-10-CM | POA: Diagnosis not present

## 2019-12-11 DIAGNOSIS — I6990 Unspecified sequelae of unspecified cerebrovascular disease: Secondary | ICD-10-CM | POA: Diagnosis not present

## 2019-12-11 DIAGNOSIS — I4819 Other persistent atrial fibrillation: Secondary | ICD-10-CM | POA: Diagnosis not present

## 2019-12-11 DIAGNOSIS — E7800 Pure hypercholesterolemia, unspecified: Secondary | ICD-10-CM | POA: Diagnosis not present

## 2019-12-24 ENCOUNTER — Ambulatory Visit
Admission: RE | Admit: 2019-12-24 | Discharge: 2019-12-24 | Disposition: A | Discharge status: Home / Self Care | Payer: Medicare PPO | Source: Ambulatory Visit | Attending: Family Medicine | Admitting: Family Medicine

## 2019-12-24 ENCOUNTER — Other Ambulatory Visit (HOSPITAL_COMMUNITY)
Admission: RE | Admit: 2019-12-24 | Discharge: 2019-12-24 | Disposition: A | Discharge status: Home / Self Care | Payer: Medicare PPO | Source: Ambulatory Visit | Attending: Radiology | Admitting: Radiology

## 2019-12-24 DIAGNOSIS — E041 Nontoxic single thyroid nodule: Secondary | ICD-10-CM | POA: Diagnosis not present

## 2019-12-25 LAB — CYTOLOGY - NON PAP

## 2020-01-29 ENCOUNTER — Encounter: Payer: Self-pay | Admitting: *Deleted

## 2020-02-13 ENCOUNTER — Ambulatory Visit: Payer: Medicare PPO | Admitting: Adult Health

## 2020-02-13 ENCOUNTER — Other Ambulatory Visit: Payer: Self-pay

## 2020-02-13 ENCOUNTER — Encounter: Payer: Self-pay | Admitting: Adult Health

## 2020-02-13 VITALS — BP 127/78 | HR 72 | Ht 65.0 in | Wt 153.0 lb

## 2020-02-13 DIAGNOSIS — E785 Hyperlipidemia, unspecified: Secondary | ICD-10-CM | POA: Diagnosis not present

## 2020-02-13 DIAGNOSIS — I10 Essential (primary) hypertension: Secondary | ICD-10-CM | POA: Diagnosis not present

## 2020-02-13 DIAGNOSIS — I4819 Other persistent atrial fibrillation: Secondary | ICD-10-CM | POA: Diagnosis not present

## 2020-02-13 DIAGNOSIS — I639 Cerebral infarction, unspecified: Secondary | ICD-10-CM

## 2020-02-13 DIAGNOSIS — E119 Type 2 diabetes mellitus without complications: Secondary | ICD-10-CM

## 2020-02-13 DIAGNOSIS — I6381 Other cerebral infarction due to occlusion or stenosis of small artery: Secondary | ICD-10-CM

## 2020-02-13 NOTE — Progress Notes (Signed)
[facility] [address] [place]. [place] [address] [phone]       OFFICE FOLLOW-UP NOTE  [name] Date of Birth:  [birth year] Medical Record Number:  [id number]   Chief complaint: Chief Complaint  Patient presents with  . Follow-up    rm 5 here for a f/u from a stroke. Pt said she has numbness in the legs. Pt also complaines of feeling dizzy.      HPI:   Today, 02/13/2020, Kelly Bates returns for stroke follow up.   Residual deficits intermittent right arm and leg numbness. Denies new stroke/TIA symptoms.  Remains on eliquis without bleeding or bruising. Continues on lovastatin without myalgias.  Blood pressure today 127/78. Does not routinely monitor glucose levels at home. Advised scheduling follow-up visit with PCP for evaluation of HTN, HLD and DM.  Underwent successful cardioversion on 10/28/2019 but unfortunately has since returned to atrial fibrillation. Ongoing complaints of intermittent lightheadedness. And palpitations -present prior to stroke. Has follow-up with cardiology in November.      History provided for reference purposes only Initial visit 10/24/2019: Kelly Bates is a pleasant 84 year old African-American lady seen today for initial office follow-up visit following hospital consultation for stroke in February 2021.  She has past medical history of diabetes, hypertension, hyperlipidemia.  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  Later on that night she noticed her right leg was also numb.  EMS were called who found her blood pressure to be elevated but she refused to be admitted.  Next day symptoms did not resolve and she did have some palpitations which she has had irregular heart beats for several months.  She had an MRI scan of the brain done which showed subcentimeter left thalamic infarct.  CT angiogram showed some diminished branches in  the left posterior cerebral artery.  Echocardiogram showed normal ejection fraction.  Cardiac monitoring showed atrial fibrillation which was of new onset.  LDL cholesterol was 47 mg percent.  Hemoglobin A1c was 6.7.  She was started on Eliquis for stroke prevention.  She states she is done well since discharge numbness is improving but she still has some numbness in the right arm as well as some clumsiness.  She gets tired more easily.  She plans on having elective cardioversion next week.  She is tolerating Eliquis well without bruising or bleeding.  Her blood pressure is well controlled today it is 117/72.  She is tolerating Mevacor well without muscle aches and pains.  She has no new complaints today.  ROS:   14 system review of systems is positive for numbness, tingling, and lightheadedness and all other systems negative  PMH:  Past Medical History:  Diagnosis Date  . Arthritis   . Asthma    pt denies  . Asthma   . Diabetes mellitus   . Diverticulosis   . Gout   . Hypertension   . Osteoporosis   . Stroke [facility])     Social History:  Social History   Socioeconomic History  . Marital status: Divorced    Spouse name: Not on file  . Number of children: Not on file  . Years of education: Not on file  . Highest education level: Not on file  Occupational History  . Not on file  Tobacco Use  . Smoking status: Current Every Day Smoker    Packs/day: 0.50    Types: Cigarettes  .  Smokeless tobacco: Never Used  Vaping Use  . Vaping Use: Never used  Substance and Sexual Activity  . Alcohol use: Yes    Alcohol/week: 14.0 standard drinks    Types: 14 Glasses of wine per week  . Drug use: No  . Sexual activity: Not Currently  Other Topics Concern  . Not on file  Social History Narrative  . Not on file   Social Determinants of Health   Financial Resource Strain:   . Difficulty of Paying Living Expenses:   Food Insecurity:   . Worried About Charity fundraiser in the Last Year:    . Arboriculturist in the Last Year:   Transportation Needs:   . Film/video editor (Medical):   Marland Kitchen Lack of Transportation (Non-Medical):   Physical Activity:   . Days of Exercise per Week:   . Minutes of Exercise per Session:   Stress:   . Feeling of Stress :   Social Connections:   . Frequency of Communication with Friends and Family:   . Frequency of Social Gatherings with Friends and Family:   . Attends Religious Services:   . Active Member of Clubs or Organizations:   . Attends Archivist Meetings:   Marland Kitchen Marital Status:   Intimate Partner Violence:   . Fear of Current or Ex-Partner:   . Emotionally Abused:   Marland Kitchen Physically Abused:   . Sexually Abused:     Medications:   Current Outpatient Medications on File Prior to Visit  Medication Sig Dispense Refill  . acetaminophen (TYLENOL) 500 MG tablet Take 1,000 mg by mouth every 6 (six) hours as needed for mild pain or headache.    Marland Kitchen amLODipine (NORVASC) 2.5 MG tablet Take 2.5 mg by mouth daily.     Marland Kitchen apixaban (ELIQUIS) 5 MG TABS tablet Take 1 tablet (5 mg total) by mouth 2 (two) times daily. 60 tablet 5  . atenolol-chlorthalidone (TENORETIC) 100-25 MG tablet Take 1 tablet by mouth daily.     Marland Kitchen lovastatin (MEVACOR) 40 MG tablet Take 40 mg by mouth at bedtime.     . metFORMIN (GLUCOPHAGE) 500 MG tablet Take 500 mg by mouth daily.     . Multiple Vitamin (MULTIVITAMIN WITH MINERALS) TABS tablet Take 1 tablet by mouth daily with breakfast.    . polyvinyl alcohol (LIQUIFILM TEARS) 1.4 % ophthalmic solution Place 1 drop into both eyes every 4 (four) hours as needed for dry eyes.    . traMADol (ULTRAM) 50 MG tablet Take 50 mg by mouth 2 (two) times daily as needed for moderate pain.      No current facility-administered medications on file prior to visit.    Allergies:   Allergies  Allergen Reactions  . Lipitor [Atorvastatin]     Bloating     Today's Vitals   02/13/20 1243  BP: 127/78  Pulse: 72  Weight: 153 lb  (69.4 kg)  Height: 5\' 5"  (1.651 m)   Body mass index is 25.46 kg/m.   Physical Exam General: well developed, well nourished pleasant elderly African-American lady, seated, in no evident distress Head: head normocephalic and atraumatic.  Neck: supple with no carotid or supraclavicular bruits Cardiovascular: irregular rate and rhythm, no murmurs Musculoskeletal: no deformity Skin:  no rash/petichiae Vascular:  Normal pulses all extremities  Neurologic Exam Mental Status: Awake and fully alert. Fluent speech and language. Oriented to place and time. Recent and remote memory intact. Attention span, concentration and fund of knowledge appropriate. Mood  and affect appropriate.  Cranial Nerves: Pupils equal, briskly reactive to light. Extraocular movements full without nystagmus. Visual fields full to confrontation. Hearing intact. Facial sensation intact. Face, tongue, palate moves normally and symmetrically.  Motor: Normal bulk and tone. Normal strength in all tested extremity muscles.  Diminished fine finger movements on the right.   Sensory.:  Diminished proximal right upper extremity touch and pinprick sensation but intact. position and vibratory sensation. Bilateral lower extremities intact. Coordination: Rapid alternating movements normal in all extremities except slightly decreased right hand. Finger-to-nose and heel-to-shin performed accurately bilaterally. Gait and Station: Arises from chair without difficulty. Stance is normal. Gait demonstrates normal stride length and balance with use of cane Reflexes: 1+ and symmetric. Toes downgoing.        ASSESSMENT/PLAN: 84 year old African-American lady with left thalamic infarct in February 2021 likely secondary to atrial fibrillation.  Vascular risk factors of hypertension, hyperlipidemia, age and atrial fibrillation    Left thalamic infarct -Residual: Right-sided numbness, intermittent. Stable without worsening -Continue Eliquis and  lovastatin for secondary stroke prevention -Ensure close PCP follow-up for aggressive stroke risk factor management including HTN (BP goal <130/90), HLD (LDL goal <70) and DM (A1c<7.0)  Atrial fibrillation -Cardioversion 10/2019 successful initially but returned back to A. fib. Complaints of intermittent lightheadedness and palpitations -advised to discuss further with cardiology. (Symptoms present prior to stroke) -Continue Eliquis and routine follow-up with cardiology for monitoring and management    Follow-up in 6 months or call earlier if needed   I spent 30 minutes of face-to-face and non-face-to-face time with patient.  This included previsit chart review, lab review, study review, order entry, electronic health record documentation, patient education regarding history of stroke with residual deficits, importance of managing stroke risk factors, lightheadedness and palpitation sensation and answered all questions to patient satisfaction    Frann Rider, AGNP-BC  [facility] [address] [place], [place] [address]  Phone [phone] Fax [phone] Note: This document was prepared with digital dictation and possible smart phrase technology. Any transcriptional errors that result from this process are unintentional.

## 2020-02-13 NOTE — Progress Notes (Signed)
I agree with the above plan 

## 2020-02-13 NOTE — Patient Instructions (Signed)
Please ensure you follow-up with cardiology as your occasional lightheadedness and palpitations could be from your atrial fibrillation  Continue Eliquis (apixaban) daily  and lovastatin  for secondary stroke prevention  Continue to follow up with PCP regarding cholesterol, blood pressure and diabetes management  Maintain strict control of hypertension with blood pressure goal below 130/90, diabetes with hemoglobin A1c goal below 6.5% and cholesterol with LDL cholesterol (bad cholesterol) goal below 70 mg/dL.       Followup in the future with me in 6 months or call earlier if needed       Thank you for coming to see [place] at [facility]. I hope we have been able to provide you high quality care today.  You may receive a patient satisfaction survey over the next few weeks. We would appreciate your feedback and comments so that we may continue to improve ourselves and the health of our patients.

## 2020-03-04 ENCOUNTER — Ambulatory Visit: Payer: Medicare PPO | Admitting: Podiatry

## 2020-03-12 DIAGNOSIS — Z79899 Other long term (current) drug therapy: Secondary | ICD-10-CM | POA: Diagnosis not present

## 2020-03-12 DIAGNOSIS — M1990 Unspecified osteoarthritis, unspecified site: Secondary | ICD-10-CM | POA: Diagnosis not present

## 2020-03-12 DIAGNOSIS — E1169 Type 2 diabetes mellitus with other specified complication: Secondary | ICD-10-CM | POA: Diagnosis not present

## 2020-03-12 DIAGNOSIS — Z0001 Encounter for general adult medical examination with abnormal findings: Secondary | ICD-10-CM | POA: Diagnosis not present

## 2020-03-12 DIAGNOSIS — E7800 Pure hypercholesterolemia, unspecified: Secondary | ICD-10-CM | POA: Diagnosis not present

## 2020-03-12 DIAGNOSIS — R7401 Elevation of levels of liver transaminase levels: Secondary | ICD-10-CM | POA: Diagnosis not present

## 2020-03-12 DIAGNOSIS — M810 Age-related osteoporosis without current pathological fracture: Secondary | ICD-10-CM | POA: Diagnosis not present

## 2020-03-12 DIAGNOSIS — I10 Essential (primary) hypertension: Secondary | ICD-10-CM | POA: Diagnosis not present

## 2020-03-12 DIAGNOSIS — I6990 Unspecified sequelae of unspecified cerebrovascular disease: Secondary | ICD-10-CM | POA: Diagnosis not present

## 2020-05-14 NOTE — Progress Notes (Signed)
CARDIOLOGY OFFICE NOTE  Date:  05/27/2020    Kelly Bates Date of Birth: 1936-06-24 Medical Record #[id number]  PCP:  Lujean Amel, MD  Cardiologist:  [facility]    Chief Complaint  Patient presents with  . Follow-up    History of Present Illness: Kelly Bates is a 84 y.o. female who presents today for a follow up visit. Seen for Dr. Marlou Porch.   She has a history of PAF - has had prior cardioversion in April of 2021 with failed results- other issues include PVCs (noted post cardioversion), chronic anticoagulation with Eliquis, prior stroke in 08/2019, low risk stress test in 2017, DM, mild carotid disease by doppler study in 2/202, moderate MR per echo and HTN.    Last seen in May following her cardioversion by Dr. Marlou Porch - she was back in AF. Opted for rate control and continued use of Eliquis.   Comes in today. Here alone. She is doing well. Some occasional awareness of her AF when she first wakes up - just notices it - does not get dizzy or lightheaded. Takes her medicines and does fine. She feels like she is doing well. No chest pain. Breathing is good. Tolerating her Eliquis without issue. No bleeding or falls. Still with some left sided numbness from her stroke. Overall, she feels like she is doing well and has no complaint or issue.   Past Medical History:  Diagnosis Date  . Arthritis   . Asthma    pt denies  . Asthma   . Diabetes mellitus   . Diverticulosis   . Gout   . Hypertension   . Osteoporosis   . Stroke [facility])     Past Surgical History:  Procedure Laterality Date  . APPENDECTOMY    . CARDIOVERSION N/A 10/28/2019   Procedure: CARDIOVERSION;  Surgeon: Jerline Pain, MD;  Location: [facility] ENDOSCOPY;  Service: Cardiovascular;  Laterality: N/A;  . COLONOSCOPY  05-2004     Medications: Current Meds  Medication Sig  . amLODipine (NORVASC) 2.5 MG tablet Take 2.5 mg by mouth daily.   Marland Kitchen apixaban (ELIQUIS) 5 MG TABS tablet Take 1 tablet (5 mg total) by mouth  2 (two) times daily.  Marland Kitchen atenolol-chlorthalidone (TENORETIC) 100-25 MG tablet Take 1 tablet by mouth daily.   Marland Kitchen lovastatin (MEVACOR) 40 MG tablet Take 40 mg by mouth at bedtime.   . metFORMIN (GLUCOPHAGE) 500 MG tablet Take 500 mg by mouth daily.   . Multiple Vitamin (MULTIVITAMIN WITH MINERALS) TABS tablet Take 1 tablet by mouth daily with breakfast.  . polyvinyl alcohol (LIQUIFILM TEARS) 1.4 % ophthalmic solution Place 1 drop into both eyes every 4 (four) hours as needed for dry eyes.  . potassium chloride SA (KLOR-CON) 20 MEQ tablet Take 20 mEq by mouth daily.  . traMADol (ULTRAM) 50 MG tablet Take 50 mg by mouth 2 (two) times daily as needed for moderate pain.      Allergies: Allergies  Allergen Reactions  . Lipitor [Atorvastatin]     Bloating    Social History: The patient  reports that she has been smoking cigarettes. She has been smoking about 0.50 packs per day. She has never used smokeless tobacco. She reports current alcohol use of about 14.0 standard drinks of alcohol per week. She reports that she does not use drugs.   Family History: The patient's family history includes CVA in her father; Diabetes in her mother; Hypertension in her mother; Lung cancer in her father.  Review of Systems: Please see the history of present illness.   All other systems are reviewed and negative.   Physical Exam: VS:  BP 130/84   Pulse 76   Ht 5\' 5"  (1.651 m)   Wt 159 lb 12.8 oz (72.5 kg)   SpO2 96%   BMI 26.59 kg/m  .  BMI Body mass index is 26.59 kg/m.  Wt Readings from Last 3 Encounters:  05/27/20 159 lb 12.8 oz (72.5 kg)  02/13/20 153 lb (69.4 kg)  11/25/19 160 lb 12.8 oz (72.9 kg)    General: Pleasant. Alert and in no acute distress.   HEENT: Normal.  Neck: Supple, no JVD, carotid bruits, or masses noted.  Cardiac: Irregular irregular rhythm. Her rate is fine.  No edema. No significant murmur that I appreciate.  Respiratory:  Lungs are clear to auscultation bilaterally with  normal work of breathing.  GI: Soft and nontender.  MS: No deformity or atrophy. Gait and ROM intact.  Skin: Warm and dry. Color is normal.  Neuro:  Strength and sensation are intact and no gross focal deficits noted.  Psych: Alert, appropriate and with normal affect.   LABORATORY DATA:  EKG:  EKG is not ordered today.    Lab Results  Component Value Date   WBC 5.6 09/06/2019   HGB 11.5 (L) 09/06/2019   HCT 34.9 (L) 09/06/2019   PLT 187 09/06/2019   GLUCOSE 194 (H) 10/24/2019   CHOL 134 09/05/2019   TRIG 70 09/05/2019   HDL 73 09/05/2019   LDLCALC 47 09/05/2019   ALT 29 09/04/2019   AST 43 (H) 09/04/2019   NA 136 10/24/2019   K 3.7 10/24/2019   CL 99 10/24/2019   CREATININE 0.57 10/24/2019   BUN 16 10/24/2019   CO2 30 (H) 10/24/2019   TSH 1.781 09/05/2019   INR 1.0 09/04/2019   HGBA1C 6.7 (H) 09/05/2019       BNP (last 3 results) No results for input(s): BNP in the last 8760 hours.  ProBNP (last 3 results) No results for input(s): PROBNP in the last 8760 hours.   Other Studies Reviewed Today:  ECHO IMPRESSIONS 08/2019  1. Left ventricular ejection fraction, by estimation, is 55 to 60%. The  left ventricle has normal function. The left ventricle has no regional  wall motion abnormalities. Left ventricular diastolic parameters are  indeterminate.  2. Right ventricular systolic function is normal. The right ventricular  size is normal. There is moderately elevated pulmonary artery systolic  pressure.  3. Left atrial size was moderately dilated.  4. Right atrial size was mildly dilated.  5. The mitral valve is normal in structure and function. Mild to moderate  mitral valve regurgitation.  6. Tricuspid valve regurgitation is mild to moderate.  7. The aortic valve is tricuspid. Aortic valve regurgitation is not  visualized. No aortic stenosis is present.  8. The inferior vena cava is normal in size with greater than 50%  respiratory variability,  suggesting right atrial pressure of 3 mmHg.      ASSESSMENT & PLAN:    1. Persistent AF - managed with rate control and anticoagulation - doing well - needs surveillance lab today. No changes made - stay on beta blocker.   2. Prior left thalamic infarct  3. Moderate MR - no significant murmur on exam - would follow.   4. Chronic anticoagulation - no problems noted - lab today. Continue current dosing of full dose Eliquis.   Current medicines are reviewed with  the patient today.  The patient does not have concerns regarding medicines other than what has been noted above.  The following changes have been made:  See above.  Labs/ tests ordered today include:    Orders Placed This Encounter  Procedures  . Basic metabolic panel  . CBC     Disposition:   FU with Dr. Marlou Porch in 6 months.      Patient is agreeable to this plan and will call if any problems develop in the interim.   SignedTruitt Merle, NP  05/27/2020 10:55 AM  [facility] [address] [place], [place]  [address] Phone: [phone] Fax: [phone]

## 2020-05-27 ENCOUNTER — Ambulatory Visit (INDEPENDENT_AMBULATORY_CARE_PROVIDER_SITE_OTHER): Payer: Medicare PPO | Admitting: Nurse Practitioner

## 2020-05-27 ENCOUNTER — Encounter: Payer: Self-pay | Admitting: Nurse Practitioner

## 2020-05-27 ENCOUNTER — Other Ambulatory Visit: Payer: Self-pay

## 2020-05-27 VITALS — BP 130/84 | HR 76 | Ht 65.0 in | Wt 159.8 lb

## 2020-05-27 DIAGNOSIS — Z7901 Long term (current) use of anticoagulants: Secondary | ICD-10-CM

## 2020-05-27 DIAGNOSIS — I4819 Other persistent atrial fibrillation: Secondary | ICD-10-CM

## 2020-05-27 DIAGNOSIS — Z79899 Other long term (current) drug therapy: Secondary | ICD-10-CM

## 2020-05-27 LAB — CBC
Hematocrit: 38.4 % (ref 34.0–46.6)
Hemoglobin: 12.8 g/dL (ref 11.1–15.9)
MCH: 30.9 pg (ref 26.6–33.0)
MCHC: 33.3 g/dL (ref 31.5–35.7)
MCV: 93 fL (ref 79–97)
Platelets: 230 10*3/uL (ref 150–450)
RBC: 4.14 x10E6/uL (ref 3.77–5.28)
RDW: 12.9 % (ref 11.7–15.4)
WBC: 6.4 10*3/uL (ref 3.4–10.8)

## 2020-05-27 LAB — BASIC METABOLIC PANEL
BUN/Creatinine Ratio: 32 — ABNORMAL HIGH (ref 12–28)
BUN: 15 mg/dL (ref 8–27)
CO2: 22 mmol/L (ref 20–29)
Calcium: 9.9 mg/dL (ref 8.7–10.3)
Chloride: 103 mmol/L (ref 96–106)
Creatinine, Ser: 0.47 mg/dL — ABNORMAL LOW (ref 0.57–1.00)
GFR calc Af Amer: 106 mL/min/{1.73_m2} (ref >59)
GFR calc non Af Amer: 92 mL/min/{1.73_m2} (ref >59)
Glucose: 161 mg/dL — ABNORMAL HIGH (ref 65–99)
Potassium: 4.6 mmol/L (ref 3.5–5.2)
Sodium: 138 mmol/L (ref 134–144)

## 2020-05-27 NOTE — Patient Instructions (Addendum)
After Visit Summary:  We will be checking the following labs today - BMET and CBC   Medication Instructions:    Continue with your current medicines.    If you need a refill on your cardiac medications before your next appointment, please call your pharmacy.     Testing/Procedures To Be Arranged:  N/A  Follow-Up:   See Dr. Marlou Porch in 6 months -  You will receive a reminder letter in the mail two months in advance. If you don't receive a letter, please call our office to schedule the follow-up appointment.     At [facility], you and your health needs are our priority.  As part of our continuing mission to provide you with exceptional heart care, we have created designated Provider Care Teams.  These Care Teams include your primary Cardiologist (physician) and Advanced Practice Providers (APPs -  Physician Assistants and Nurse Practitioners) who all work together to provide you with the care you need, when you need it.  Special Instructions:  . Stay safe, wash your hands for at least 20 seconds and wear a mask when needed.  . It was good to talk with you today.    Call the [place] office at [phone] if you have any questions, problems or concerns.

## 2020-06-05 ENCOUNTER — Ambulatory Visit: Payer: Medicare PPO | Admitting: Podiatry

## 2020-06-05 ENCOUNTER — Other Ambulatory Visit: Payer: Self-pay

## 2020-06-05 ENCOUNTER — Encounter: Payer: Self-pay | Admitting: Podiatry

## 2020-06-05 DIAGNOSIS — B351 Tinea unguium: Secondary | ICD-10-CM

## 2020-06-05 DIAGNOSIS — D689 Coagulation defect, unspecified: Secondary | ICD-10-CM | POA: Diagnosis not present

## 2020-06-05 DIAGNOSIS — M79675 Pain in left toe(s): Secondary | ICD-10-CM

## 2020-06-05 DIAGNOSIS — M79674 Pain in right toe(s): Secondary | ICD-10-CM | POA: Diagnosis not present

## 2020-06-05 DIAGNOSIS — E119 Type 2 diabetes mellitus without complications: Secondary | ICD-10-CM | POA: Diagnosis not present

## 2020-06-05 NOTE — Progress Notes (Signed)
This patient returns to my office for at risk foot care.  This patient requires this care by a professional since this patient will be at risk due to having coagulation defect and diabetes.  Patient is taking eliquis.  This patient is unable to cut nails herself since the patient cannot reach her nails.These nails are painful walking and wearing shoes.  This patient presents for at risk foot care today.  General Appearance  Alert, conversant and in no acute stress.  Vascular  Dorsalis pedis and posterior tibial  pulses are palpable  bilaterally.  Capillary return is within normal limits  bilaterally. Temperature is within normal limits  bilaterally.  Neurologic  Senn-Weinstein monofilament wire test diminished   bilaterally. Muscle power within normal limits bilaterally.  Nails Thick disfigured discolored nails with subungual debris  from hallux to fifth toes bilaterally. No evidence of bacterial infection or drainage bilaterally.  Orthopedic  No limitations of motion  feet .  No crepitus or effusions noted.  No bony pathology or digital deformities noted.  HAV  B/L  Skin  normotropic skin with no porokeratosis noted bilaterally.  No signs of infections or ulcers noted.     Onychomycosis  Pain in right toes  Pain in left toes  Consent was obtained for treatment procedures.   Mechanical debridement of nails 1-5  bilaterally performed with a nail nipper.  Filed with dremel without incident. Patient qualifies for diabetic shoes due to DPN and HAV  B/L.   Return office visit    3 months                  Told patient to return for periodic foot care and evaluation due to potential at risk complications.   Gardiner Barefoot DPM

## 2020-07-06 IMAGING — CT CT ANGIO NECK
1 of 11 series · 5 of 33 positions shown · IV contrast (omnipaque)
Comparison: MRI of the brain September 04, 2019.

CLINICAL DATA: Stroke/TIA. Assess for intracardiac shunt.

EXAM:
CT ANGIOGRAPHY HEAD AND NECK
TECHNIQUE: Multidetector CT imaging of the head and neck was performed using
the standard protocol during bolus administration of intravenous
contrast. Multiplanar CT image reconstructions and MIPs were
obtained to evaluate the vascular anatomy. Carotid stenosis
measurements (when applicable) are obtained utilizing NASCET
criteria, using the distal internal carotid diameter as the
denominator.
CONTRAST:  100mL OMNIPAQUE IOHEXOL 350 MG/ML SOLN

[Series 12: ax thins · axial · 0.39mm/px · z∈[+1072,+1296]mm · 5 of 338 slices shown]
[im 57/338  soft-tissue]
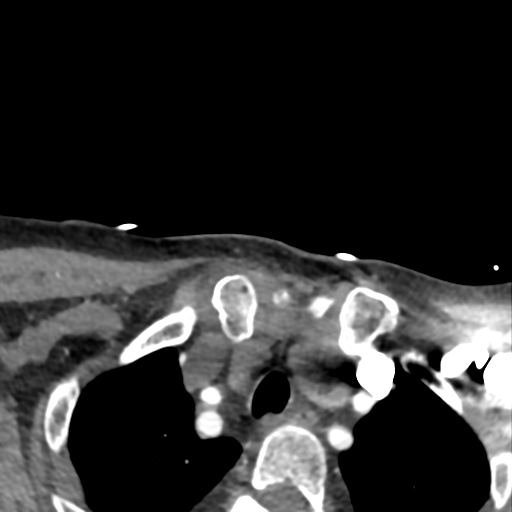
[im 113/338  bone]
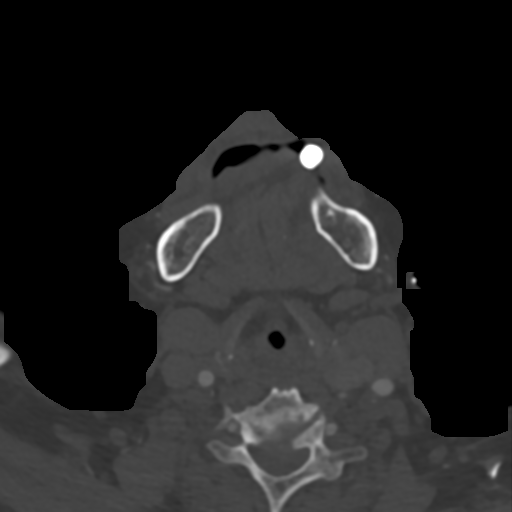
[im 169/338  soft-tissue]
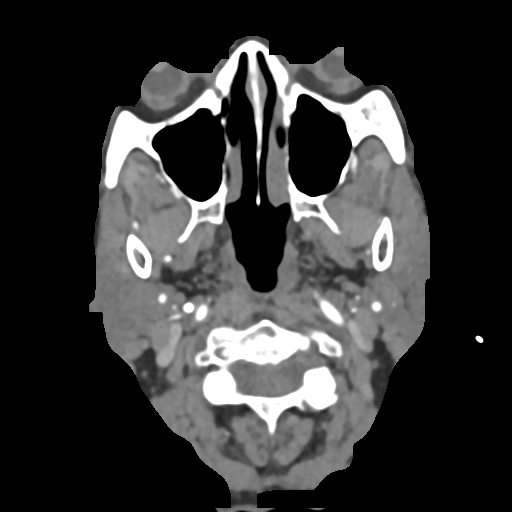
[im 225/338  bone]
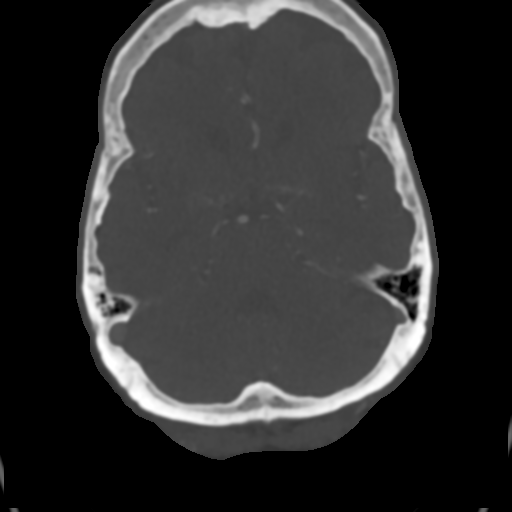
[im 281/338  soft-tissue]
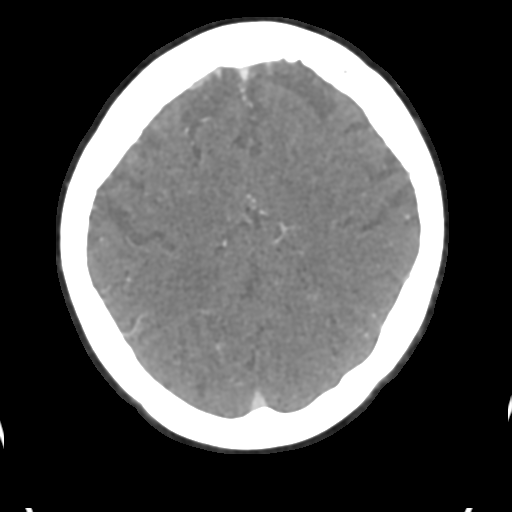

[5 of 33 positions shown; findings below may reference images not displayed]

FINDINGS: CT HEAD FINDINGS

Brain: No evidence of hemorrhage, hydrocephalus, extra-axial
collection or mass lesion/mass effect. Hypodense focus in the left
thalamus corresponds to acute infarct described on prior MRI.
Hypodensity of the periventricular white matter extending into the
centrum semiovale bilaterally most likely represents chronic small
vessel ischemia. Prominence of the supratentorial ventricles and
cerebral sulci is related to parenchymal volume loss.

Vascular: No hyperdense vessel or unexpected calcification.

Skull: Normal. Negative for fracture or focal lesion.

Sinuses: Imaged portions are clear.

Orbits: No acute finding.

Review of the MIP images confirms the above findings

CTA NECK FINDINGS

Aortic arch: Common origin of the innominate and left common carotid
artery. Imaged portion shows no evidence of aneurysm or dissection.
Mild calcified atherosclerotic plaques are seen in the aortic arch.
No significant stenosis of the major arch vessel origins.

Right carotid system: Calcified plaque is seen in the right carotid
bulb without hemodynamically significant stenosis.

Left carotid system: Calcified plaque is seen in the left parotid
bulb, without hemodynamically significant stenosis.

Vertebral arteries: The left vertebral artery is dominant. No
evidence of dissection, stenosis (50% or greater) or occlusion.

Skeleton: Elongated styloid process on the right side. Otherwise
negative.

Other neck: Enlarged multinodular thyroid gland.

Upper chest: Negative

Review of the MIP images confirms the above findings

CTA HEAD FINDINGS

Anterior circulation: No significant stenosis, proximal occlusion,
aneurysm, or vascular malformation. Small calcified plaques are seen
in the bilateral carotid siphons

Posterior circulation: Attenuation of the left PCA branches are
noted at the P2A/P2P junction, may represent atherosclerotic disease
versus subocclusive clot.

Venous sinuses: As permitted by contrast timing, patent.

Anatomic variants: No clinical significant anatomical variation.

Review of the MIP images confirms the above findings
IMPRESSION: 1. Attenuation of left PCA branches at the P2A/P2P junction, may
represent atherosclerotic disease versus subocclusive clot.
2. No hemodynamically significant stenosis of the carotid arteries
in the neck.
3. Hypodense focus in the left thalamus corresponds to acute infarct
described on prior MRI.
4. Enlarged multinodular thyroid gland. Consider ultrasound for
characterization.

Aortic Atherosclerosis (NQ4PT-UC1.1).

## 2020-07-21 DIAGNOSIS — R7401 Elevation of levels of liver transaminase levels: Secondary | ICD-10-CM | POA: Diagnosis not present

## 2020-07-27 ENCOUNTER — Telehealth: Payer: Self-pay | Admitting: Cardiology

## 2020-07-27 MED ORDER — APIXABAN 5 MG PO TABS: 5.0000 mg | ORAL_TABLET | Freq: Two times a day (BID) | ORAL | 1 refills | Status: DC

## 2020-07-27 NOTE — Telephone Encounter (Signed)
*  STAT* If patient is at the pharmacy, call can be transferred to refill team.   1. Which medications need to be refilled? (please list name of each medication and dose if known) Elquis  2. Which pharmacy/location (including street and city if local pharmacy) is medication to be sent to? [facility]   3. Do they need a 30 day or 90 day supply? [place]

## 2020-07-27 NOTE — Telephone Encounter (Signed)
Prescription refill request for Eliquis received. Indication:afib Last office visit:05/27/20 Scr:0.47 Age: 85 Weight:72kg

## 2020-08-17 ENCOUNTER — Ambulatory Visit: Payer: Medicare PPO | Admitting: Adult Health

## 2020-08-17 ENCOUNTER — Encounter: Payer: Self-pay | Admitting: Adult Health

## 2020-08-17 VITALS — BP 115/70 | HR 72 | Ht 65.0 in | Wt 162.6 lb

## 2020-08-17 DIAGNOSIS — I4819 Other persistent atrial fibrillation: Secondary | ICD-10-CM | POA: Diagnosis not present

## 2020-08-17 DIAGNOSIS — R200 Anesthesia of skin: Secondary | ICD-10-CM

## 2020-08-17 DIAGNOSIS — I6381 Other cerebral infarction due to occlusion or stenosis of small artery: Secondary | ICD-10-CM

## 2020-08-17 DIAGNOSIS — I639 Cerebral infarction, unspecified: Secondary | ICD-10-CM | POA: Diagnosis not present

## 2020-08-17 DIAGNOSIS — R202 Paresthesia of skin: Secondary | ICD-10-CM | POA: Diagnosis not present

## 2020-08-17 NOTE — Progress Notes (Signed)
I agree with the above plan 

## 2020-08-17 NOTE — Patient Instructions (Signed)
Continue Eliquis (apixaban) daily  and lovastatin for secondary stroke prevention  Continue to follow cardiology for atrial fibrillation and Eliquis management as well as occasional dizziness concerns and lower extremity swelling  Continue to follow up with PCP regarding cholesterol and blood pressure management  Maintain strict control of hypertension with blood pressure goal below 130/90 and cholesterol with LDL cholesterol (bad cholesterol) goal below 70 mg/dL.       Followup in the future with me in 1 year or call earlier if needed       Thank you for coming to see [place] at [facility]. I hope we have been able to provide you high quality care today.  You may receive a patient satisfaction survey over the next few weeks. We would appreciate your feedback and comments so that we may continue to improve ourselves and the health of our patients.

## 2020-08-17 NOTE — Progress Notes (Signed)
[facility] [address] [place]. [place] [address] [phone]       OFFICE FOLLOW-UP NOTE  [name] Date of Birth:  September 09, 1935 Medical Record Number:  [id number]   Chief complaint: Chief Complaint  Patient presents with  . Follow-up    RM 14 alone Pt states thing have been fair, some dizziness and numbness in R leg and arm      HPI:   Today, 08/17/2020, [name] returns for 37-month stroke follow-up unaccompanied.  Stable from stroke standpoint without new stroke/TIA symptoms and reports residual right leg and arm numbness with some improvement since prior visit.  She also reports occasional sensation of dizziness/lightheadedness which can occur randomly and not necessarily associated with position changes or head movements.  Remains on Eliquis and lovastatin for secondary stroke prevention without side effects.  Blood pressure today 115/70.  No further concerns at this time.   History provided for reference purposes only Update 02/13/2020 JM: [name] returns for stroke follow up.  Residual deficits intermittent right arm and leg numbness. Denies new stroke/TIA symptoms. Remains on eliquis without bleeding or bruising. Continues on lovastatin without myalgias.  Blood pressure today 127/78. Does not routinely monitor glucose levels at home. Advised scheduling follow-up visit with PCP for evaluation of HTN, HLD and DM. Underwent cardioversion on 10/28/2019 but unfortunately has since returned to atrial fibrillation. Ongoing complaints of intermittent lightheadedness and palpitations -present prior to stroke. Has follow-up with cardiology in November.  Initial visit 10/24/2019: [name] is a pleasant 85 year old African-American lady seen today for initial office follow-up visit following hospital consultation for stroke in February 2021.  She has past medical history of diabetes, hypertension, hyperlipidemia.  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  Later on that night she noticed her right leg was also numb.  EMS were called who found her blood pressure to be elevated but she refused to be admitted.  Next day symptoms did not resolve and she did have some palpitations which she has had irregular heart beats for several months.  She had an MRI scan of the brain done which showed subcentimeter left thalamic infarct.  CT angiogram showed some diminished branches in the left posterior cerebral artery.  Echocardiogram showed normal ejection fraction.  Cardiac monitoring showed atrial fibrillation which was of new onset.  LDL cholesterol was 47 mg percent.  Hemoglobin A1c was 6.7.  She was started on Eliquis for stroke prevention.  She states she is done well since discharge numbness is improving but she still has some numbness in the right arm as well as some clumsiness.  She gets tired more easily.  She plans on having elective cardioversion next week.  She is tolerating Eliquis well without bruising or bleeding.  Her blood pressure is well controlled today it is 117/72.  She is tolerating Mevacor well without muscle aches and pains.  She has no new complaints today.  ROS:   14 system review of systems is positive for those listed in HPI and all other systems negative  PMH:  Past Medical History:  Diagnosis Date  . Arthritis   . Asthma    pt denies  . Asthma   . Diabetes mellitus   . Diverticulosis   . Gout   . Hypertension   . Osteoporosis   . Stroke [facility])     Social History:  Social History  Socioeconomic History  . Marital status: Divorced    Spouse name: Not on file  . Number of children: Not on file  . Years of education: Not on file  . Highest education level: Not on file  Occupational History  . Not on file  Tobacco Use  . Smoking status: Current Every Day Smoker    Packs/day: 0.50    Types: Cigarettes  . Smokeless tobacco: Never  Used  Vaping Use  . Vaping Use: Never used  Substance and Sexual Activity  . Alcohol use: Yes    Alcohol/week: 14.0 standard drinks    Types: 14 Glasses of wine per week  . Drug use: No  . Sexual activity: Not Currently  Other Topics Concern  . Not on file  Social History Narrative  . Not on file   Social Determinants of Health   Financial Resource Strain: Not on file  Food Insecurity: Not on file  Transportation Needs: Not on file  Physical Activity: Not on file  Stress: Not on file  Social Connections: Not on file  Intimate Partner Violence: Not on file    Medications:   Current Outpatient Medications on File Prior to Visit  Medication Sig Dispense Refill  . amLODipine (NORVASC) 2.5 MG tablet Take 2.5 mg by mouth daily.     Marland Kitchen apixaban (ELIQUIS) 5 MG TABS tablet Take 1 tablet (5 mg total) by mouth 2 (two) times daily. 180 tablet 1  . atenolol-chlorthalidone (TENORETIC) 100-25 MG tablet Take 1 tablet by mouth daily.     Marland Kitchen lovastatin (MEVACOR) 40 MG tablet Take 40 mg by mouth at bedtime.     . metFORMIN (GLUCOPHAGE) 500 MG tablet Take 500 mg by mouth daily.     . Multiple Vitamin (MULTIVITAMIN WITH MINERALS) TABS tablet Take 1 tablet by mouth daily with breakfast.    . polyvinyl alcohol (LIQUIFILM TEARS) 1.4 % ophthalmic solution Place 1 drop into both eyes every 4 (four) hours as needed for dry eyes.    . potassium chloride SA (KLOR-CON) 20 MEQ tablet Take 20 mEq by mouth daily.    . traMADol (ULTRAM) 50 MG tablet Take 50 mg by mouth 2 (two) times daily as needed for moderate pain.      No current facility-administered medications on file prior to visit.    Allergies:   Allergies  Allergen Reactions  . Lipitor [Atorvastatin]     Bloating     Today's Vitals   08/17/20 1238  BP: 115/70  Pulse: 72  Weight: 162 lb 9.6 oz (73.8 kg)  Height: 5\' 5"  (1.651 m)   Body mass index is 27.06 kg/m.   Physical Exam General: well developed, well nourished pleasant elderly  African-American lady, seated, in no evident distress Head: head normocephalic and atraumatic.  Neck: supple with no carotid or supraclavicular bruits Cardiovascular: irregular rate and rhythm, no murmurs; +2 BLE edema Musculoskeletal: no deformity Skin:  no rash/petichiae Vascular:  Normal pulses all extremities  Neurologic Exam Mental Status: Awake and fully alert. Fluent speech and language. Oriented to place and time. Recent and remote memory intact. Attention span, concentration and fund of knowledge appropriate. Mood and affect appropriate.  Cranial Nerves: Pupils equal, briskly reactive to light. Extraocular movements full without nystagmus. Visual fields full to confrontation. Hearing intact. Facial sensation intact. Face, tongue, palate moves normally and symmetrically.  Motor: Normal bulk and tone. Normal strength in all tested extremity muscles.  Diminished fine finger movements on the right.   Sensory.:  Sensory  intact to light touch, vibratory and pinprick sensation throughout all tested extremities Coordination: Rapid alternating movements normal in all extremities. Finger-to-nose and heel-to-shin performed accurately bilaterally. Gait and Station: Arises from chair without difficulty. Stance is normal. Gait demonstrates normal stride length and balance without use of assistive device Reflexes: 1+ and symmetric. Toes downgoing.        ASSESSMENT/PLAN: 85 year old African-American lady with left thalamic infarct in February 2021 likely secondary to atrial fibrillation.  Vascular risk factors of hypertension, hyperlipidemia, age and atrial fibrillation    Left thalamic infarct -Residual: Subjective right arm and leg numbness  -some improvement since prior visit -Continue Eliquis and lovastatin for secondary stroke prevention -Discussed secondary stroke prevention measures and importance of Ensure close PCP/cardiology follow-up for aggressive stroke risk factor management  including HTN (BP goal <130/90), HLD (LDL goal <70) and DM (A1c<7.0)  Atrial fibrillation -s/p unsuccessful cardioversion 10/2019  -Remains on Eliquis 5 mg twice daily per cardiology -Does report occasional dizziness/lightheadedness as well as BLE edema -advised to discuss further with cardiology at follow-up visit.  Encouraged use of compression stockings, elevation and limiting sodium    Follow-up in 1 year or call earlier if needed   CC:  [place] provider: Dr. Jerold Coombe, [place], MD    I spent 30 minutes of face-to-face and non-face-to-face time with patient.  This included previsit chart review, lab review, study review, order entry, electronic health record documentation, patient education regarding history of stroke with residual deficits, importance of managing stroke risk factors and answered all other questions to patient satisfaction    Frann Rider, [facility]  [facility] [address] [place], [place] [address]  Phone [phone] Fax [phone] Note: This document was prepared with digital dictation and possible smart phrase technology. Any transcriptional errors that result from this process are unintentional.

## 2020-09-11 ENCOUNTER — Other Ambulatory Visit: Payer: Self-pay

## 2020-09-11 ENCOUNTER — Ambulatory Visit: Payer: Medicare PPO | Admitting: Podiatry

## 2020-09-11 ENCOUNTER — Encounter: Payer: Self-pay | Admitting: Podiatry

## 2020-09-11 DIAGNOSIS — M79674 Pain in right toe(s): Secondary | ICD-10-CM | POA: Diagnosis not present

## 2020-09-11 DIAGNOSIS — M79675 Pain in left toe(s): Secondary | ICD-10-CM | POA: Diagnosis not present

## 2020-09-11 DIAGNOSIS — D689 Coagulation defect, unspecified: Secondary | ICD-10-CM | POA: Diagnosis not present

## 2020-09-11 DIAGNOSIS — E119 Type 2 diabetes mellitus without complications: Secondary | ICD-10-CM

## 2020-09-11 DIAGNOSIS — B351 Tinea unguium: Secondary | ICD-10-CM | POA: Diagnosis not present

## 2020-12-09 ENCOUNTER — Ambulatory Visit: Payer: Medicare PPO | Admitting: Podiatry

## 2020-12-11 ENCOUNTER — Other Ambulatory Visit: Payer: Self-pay

## 2020-12-11 ENCOUNTER — Encounter: Payer: Self-pay | Admitting: Podiatry

## 2020-12-11 ENCOUNTER — Ambulatory Visit: Payer: Medicare PPO | Admitting: Podiatry

## 2020-12-11 DIAGNOSIS — M79675 Pain in left toe(s): Secondary | ICD-10-CM

## 2020-12-11 DIAGNOSIS — B351 Tinea unguium: Secondary | ICD-10-CM | POA: Diagnosis not present

## 2020-12-11 DIAGNOSIS — E119 Type 2 diabetes mellitus without complications: Secondary | ICD-10-CM | POA: Diagnosis not present

## 2020-12-11 DIAGNOSIS — M79674 Pain in right toe(s): Secondary | ICD-10-CM | POA: Diagnosis not present

## 2020-12-11 DIAGNOSIS — D689 Coagulation defect, unspecified: Secondary | ICD-10-CM

## 2020-12-11 NOTE — Progress Notes (Signed)
This patient returns to my office for at risk foot care.  This patient requires this care by a professional since this patient will be at risk due to having coagulation defect and diabetes.  Patient is taking eliquis.  This patient is unable to cut nails herself since the patient cannot reach her nails.These nails are painful walking and wearing shoes.  This patient presents for at risk foot care today.  General Appearance  Alert, conversant and in no acute stress.  Vascular  Dorsalis pedis and posterior tibial  pulses are palpable  bilaterally.  Capillary return is within normal limits  bilaterally. Temperature is within normal limits  bilaterally.  Neurologic  Senn-Weinstein monofilament wire test diminished   bilaterally. Muscle power within normal limits bilaterally.  Nails Thick disfigured discolored nails with subungual debris  from hallux to fifth toes bilaterally. No evidence of bacterial infection or drainage bilaterally.  Orthopedic  No limitations of motion  feet .  No crepitus or effusions noted.  No bony pathology or digital deformities noted.  HAV  B/L  Skin  normotropic skin with no porokeratosis noted bilaterally.  No signs of infections or ulcers noted.     Onychomycosis  Pain in right toes  Pain in left toes  Consent was obtained for treatment procedures.   Mechanical debridement of nails 1-5  bilaterally performed with a nail nipper.  Filed with dremel without incident.   Return office visit    3 months                  Told patient to return for periodic foot care and evaluation due to potential at risk complications.   Gardiner Barefoot DPM

## 2021-01-29 ENCOUNTER — Other Ambulatory Visit: Payer: Self-pay | Admitting: Cardiology

## 2021-01-29 DIAGNOSIS — Z7901 Long term (current) use of anticoagulants: Secondary | ICD-10-CM

## 2021-01-29 NOTE — Telephone Encounter (Signed)
Received refill request for pt's Eliquis 5 mg BID. Pt's age 85, wt 73.8 kg, pt is overdue for labs and appt w/ Dr. Marlou Porch. Will send in 30 day supply and route to scheduling.

## 2021-02-01 ENCOUNTER — Telehealth: Payer: Self-pay

## 2021-02-01 NOTE — Telephone Encounter (Signed)
Received a call from the patient who states she received a call from our office and she is returning the call. She states she needs a refill on her Eliquis.   Chart reviewed.   I advised patient that her medication was sent to the pharmacy on 01/29/21 to [facility]. I informed patient that she needed to schedule a follow up appointment with Dr Marlou Porch. She requested I transfer her to the scheduling dept.   Patient transferred and thanked me for helping her.

## 2021-03-17 ENCOUNTER — Ambulatory Visit: Payer: Medicare PPO | Admitting: Podiatry

## 2021-03-31 ENCOUNTER — Other Ambulatory Visit: Payer: Self-pay

## 2021-03-31 ENCOUNTER — Encounter: Payer: Self-pay | Admitting: Podiatry

## 2021-03-31 ENCOUNTER — Ambulatory Visit (INDEPENDENT_AMBULATORY_CARE_PROVIDER_SITE_OTHER): Payer: Medicare PPO | Admitting: Podiatry

## 2021-03-31 DIAGNOSIS — M79674 Pain in right toe(s): Secondary | ICD-10-CM

## 2021-03-31 DIAGNOSIS — M79675 Pain in left toe(s): Secondary | ICD-10-CM

## 2021-03-31 DIAGNOSIS — D689 Coagulation defect, unspecified: Secondary | ICD-10-CM

## 2021-03-31 DIAGNOSIS — B351 Tinea unguium: Secondary | ICD-10-CM

## 2021-03-31 DIAGNOSIS — E119 Type 2 diabetes mellitus without complications: Secondary | ICD-10-CM | POA: Diagnosis not present

## 2021-03-31 NOTE — Progress Notes (Signed)
This patient returns to my office for at risk foot care.  This patient requires this care by a professional since this patient will be at risk due to having coagulation defect and diabetes.  Patient is taking eliquis.  This patient is unable to cut nails herself since the patient cannot reach her nails.These nails are painful walking and wearing shoes.  This patient presents for at risk foot care today.  General Appearance  Alert, conversant and in no acute stress.  Vascular  Dorsalis pedis and posterior tibial  pulses are palpable  bilaterally.  Capillary return is within normal limits  bilaterally. Temperature is within normal limits  bilaterally. Digital hair noted.  Neurologic  Senn-Weinstein monofilament wire test diminished   bilaterally. Muscle power within normal limits bilaterally.  Nails Thick disfigured discolored nails with subungual debris  from hallux to fifth toes bilaterally. No evidence of bacterial infection or drainage bilaterally.  Orthopedic  No limitations of motion  feet .  No crepitus or effusions noted.  No bony pathology or digital deformities noted.  HAV  B/L  Skin  normotropic skin with no porokeratosis noted bilaterally.  No signs of infections or ulcers noted.     Onychomycosis  Pain in right toes  Pain in left toes  Consent was obtained for treatment procedures.   Mechanical debridement of nails 1-5  bilaterally performed with a nail nipper.  Filed with dremel without incident. Patient qualifies for diabetic shoes due to DPN and HAV  B/L.   Return office visit    3 months                  Told patient to return for periodic foot care and evaluation due to potential at risk complications.   Gardiner Barefoot DPM

## 2021-04-08 DIAGNOSIS — E1169 Type 2 diabetes mellitus with other specified complication: Secondary | ICD-10-CM | POA: Diagnosis not present

## 2021-04-08 DIAGNOSIS — H9193 Unspecified hearing loss, bilateral: Secondary | ICD-10-CM | POA: Diagnosis not present

## 2021-04-08 DIAGNOSIS — Z79899 Other long term (current) drug therapy: Secondary | ICD-10-CM | POA: Diagnosis not present

## 2021-04-08 DIAGNOSIS — R439 Unspecified disturbances of smell and taste: Secondary | ICD-10-CM | POA: Diagnosis not present

## 2021-04-08 DIAGNOSIS — I4819 Other persistent atrial fibrillation: Secondary | ICD-10-CM | POA: Diagnosis not present

## 2021-04-08 DIAGNOSIS — Z23 Encounter for immunization: Secondary | ICD-10-CM | POA: Diagnosis not present

## 2021-04-08 DIAGNOSIS — F419 Anxiety disorder, unspecified: Secondary | ICD-10-CM | POA: Diagnosis not present

## 2021-04-08 DIAGNOSIS — I10 Essential (primary) hypertension: Secondary | ICD-10-CM | POA: Diagnosis not present

## 2021-04-08 DIAGNOSIS — Z0001 Encounter for general adult medical examination with abnormal findings: Secondary | ICD-10-CM | POA: Diagnosis not present

## 2021-04-08 DIAGNOSIS — E7800 Pure hypercholesterolemia, unspecified: Secondary | ICD-10-CM | POA: Diagnosis not present

## 2021-04-12 ENCOUNTER — Ambulatory Visit (INDEPENDENT_AMBULATORY_CARE_PROVIDER_SITE_OTHER): Payer: Medicare PPO | Admitting: Podiatry

## 2021-04-12 ENCOUNTER — Other Ambulatory Visit: Payer: Self-pay

## 2021-04-12 DIAGNOSIS — M79675 Pain in left toe(s): Secondary | ICD-10-CM

## 2021-04-12 DIAGNOSIS — M79674 Pain in right toe(s): Secondary | ICD-10-CM

## 2021-04-12 DIAGNOSIS — B351 Tinea unguium: Secondary | ICD-10-CM

## 2021-04-12 DIAGNOSIS — E119 Type 2 diabetes mellitus without complications: Secondary | ICD-10-CM

## 2021-04-12 NOTE — Progress Notes (Signed)
The patient presented to the office today for a foam casting for 3 pair custom diabetic shoe inserts and patient wanted to know how much she would have to pay and how much the shoes and inserts cost and I stated that the insurance [facility]) would cover 80% and patient is responsible for 20% which would be some where around $80.00 and then I stated that the diabetic shoes will be billed $190.00 and the custom diabetic inserts would be billed $414.00 and patient decided not to get the shoes or inserts and would go on line and get diabetic shoes and I stated that was fine and if the patient changes her mind we can mold and measure for shoes and inserts.   Gardiner Barefoot DPM

## 2021-04-15 NOTE — Progress Notes (Signed)
Cardiology Office Note    Date:  04/21/2021   ID:  Kelly Bates, Kelly Bates 03-Jul-1936, MRN [id number]   PCP:  Lujean Amel, [place]  Cardiologist:  Candee Furbish, MD   Advanced Practice Provider:  No care team member to display Electrophysiologist:  None   [id number]   Chief Complaint  Patient presents with   Follow-up    History of Present Illness:  Kelly Bates is a 85 y.o. female with history of  Permanent Afib DCCV 10/2019 but back in AF at f/u and aim for rate control on Eliquis, CVA 08/2019, PVC's HTN, Mod MR on echo 08/2019, Carotid disease-1-39% doppler 08/2019,HLD.  Last seen 05/2020 and doing well.  Patient comes in for f/u. Notices skipping in the am but not fast or slow. Can't walk fast from CVA. Has some dizziness when moving around. No syncope, chest pain, dyspnea. Had blood work at PCP last week and TSH 2.6, other labs stable.Drinks 2-3 glasses of wine daily. No regular exercise.        Past Medical History:  Diagnosis Date   Arthritis    Asthma    pt denies   Asthma    Diabetes mellitus    Diverticulosis    Gout    Hypertension    Osteoporosis    Stroke [facility])     Past Surgical History:  Procedure Laterality Date   APPENDECTOMY     CARDIOVERSION N/A 10/28/2019   Procedure: CARDIOVERSION;  Surgeon: Jerline Pain, MD;  Location: [facility];  Service: Cardiovascular;  Laterality: N/A;   COLONOSCOPY  05-2004    Current Medications: Current Meds  Medication Sig   amLODipine (NORVASC) 2.5 MG tablet Take 2.5 mg by mouth daily.    atenolol-chlorthalidone (TENORETIC 50) 50-25 MG tablet Take 1 tablet by mouth daily.   ELIQUIS 5 MG TABS tablet TAKE 1 TABLET TWICE DAILY   lovastatin (MEVACOR) 40 MG tablet Take 40 mg by mouth at bedtime.    metFORMIN (GLUCOPHAGE) 500 MG tablet Take 500 mg by mouth daily.    Multiple Vitamin (MULTIVITAMIN WITH MINERALS) TABS tablet Take 1 tablet by mouth daily with breakfast.   polyvinyl  alcohol (LIQUIFILM TEARS) 1.4 % ophthalmic solution Place 1 drop into both eyes every 4 (four) hours as needed for dry eyes.   potassium chloride SA (KLOR-CON) 20 MEQ tablet Take 20 mEq by mouth daily.   traMADol (ULTRAM) 50 MG tablet Take 50 mg by mouth 2 (two) times daily as needed for moderate pain.    [DISCONTINUED] atenolol-chlorthalidone (TENORETIC) 100-25 MG tablet Take 1 tablet by mouth daily.      Allergies:   Atorvastatin   Social History   Socioeconomic History   Marital status: Divorced    Spouse name: Not on file   Number of children: Not on file   Years of education: Not on file   Highest education level: Not on file  Occupational History   Not on file  Tobacco Use   Smoking status: Every Day    Packs/day: 0.50    Types: Cigarettes   Smokeless tobacco: Never  Vaping Use   Vaping Use: Never used  Substance and Sexual Activity   Alcohol use: Yes    Alcohol/week: 14.0 standard drinks    Types: 14 Glasses of wine per week   Drug use: No   Sexual activity: Not Currently  Other Topics Concern   Not on file  Social History Narrative  Not on file   Social Determinants of Health   Financial Resource Strain: Not on file  Food Insecurity: Not on file  Transportation Needs: Not on file  Physical Activity: Not on file  Stress: Not on file  Social Connections: Not on file     Family History:  The patient's  family history includes CVA in her father; Diabetes in her mother; Hypertension in her mother; Lung cancer in her father.   ROS:   Please see the history of present illness.    ROS All other systems reviewed and are negative.   PHYSICAL EXAM:   VS:  BP 110/70 (BP Location: Left Arm, Patient Position: Sitting, Cuff Size: Normal)   Pulse (!) 51   Ht 5\' 5"  (1.651 m)   Wt 169 lb (76.7 kg)   SpO2 97%   BMI 28.12 kg/m   Physical Exam  GEN: Well nourished, well developed, in no acute distress  Neck: no JVD, carotid bruits, or masses Cardiac:irreg irreg 2/6  systolic murmur apex Respiratory:  clear to auscultation bilaterally, normal work of breathing GI: soft, nontender, nondistended, + BS Ext: without cyanosis, clubbing, or edema, Good distal pulses bilaterally Neuro:  Alert and Oriented x 3 Psych: euthymic mood, full affect  Wt Readings from Last 3 Encounters:  04/21/21 169 lb (76.7 kg)  08/17/20 162 lb 9.6 oz (73.8 kg)  05/27/20 159 lb 12.8 oz (72.5 kg)      Studies/Labs Reviewed:   EKG:  EKG is  ordered today.  The ekg ordered today demonstrates Afib 51/m with slower rates  Recent Labs: 05/27/2020: BUN 15; Creatinine, Ser 0.47; Hemoglobin 12.8; Platelets 230; Potassium 4.6; Sodium 138   Lipid Panel    Component Value Date/Time   CHOL 134 09/05/2019 0247   TRIG 70 09/05/2019 0247   HDL 73 09/05/2019 0247   CHOLHDL 1.8 09/05/2019 0247   VLDL 14 09/05/2019 0247   LDLCALC 47 09/05/2019 0247    Additional studies/ records that were reviewed today include:  ECHO IMPRESSIONS 08/2019   1. Left ventricular ejection fraction, by estimation, is 55 to 60%. The  left ventricle has normal function. The left ventricle has no regional  wall motion abnormalities. Left ventricular diastolic parameters are  indeterminate.   2. Right ventricular systolic function is normal. The right ventricular  size is normal. There is moderately elevated pulmonary artery systolic  pressure.   3. Left atrial size was moderately dilated.   4. Right atrial size was mildly dilated.   5. The mitral valve is normal in structure and function. Mild to moderate  mitral valve regurgitation.   6. Tricuspid valve regurgitation is mild to moderate.   7. The aortic valve is tricuspid. Aortic valve regurgitation is not  visualized. No aortic stenosis is present.   8. The inferior vena cava is normal in size with greater than 50%  respiratory variability, suggesting right atrial pressure of 3 mmHg.        Risk Assessment/Calculations:    CHA2DS2-VASc Score =  8   This indicates a 10.8% annual risk of stroke. The patient's score is based upon: CHF History: 0 HTN History: 1 Diabetes History: 1 Stroke History: 2 Vascular Disease History: 1 Age Score: 2 Gender Score: 1       ASSESSMENT:    1. Persistent atrial fibrillation ([place])   2. Left thalamic infarction ([place])   3. PVC (premature ventricular contraction)   4. Essential hypertension   5. Moderate mitral regurgitation   6.  Bilateral carotid artery stenosis   7. Hyperlipidemia, unspecified hyperlipidemia type      PLAN:  In order of problems listed above:  Permanent Afib DCCV 10/2019 but back in AF at f/u and aim for rate control on Elliquis-patient having some dizziness and an EKG today shows slow A. fib at 51 with some slower rates at the beginning of the tracing.  We will decrease Tenoretic to 50/25 mg daily and place a ZIO monitor.  No bleeding problems on Eliquis.  Labs checked by PCP at [facility] 04/08/2021 creatinine 0.58  CVA 08/2019  PVC  HTN blood pressure controlled but she will keep track of it since we are decreasing her Tenoretic.  Mild to mod MR on echo 08/2019.  Asymptomatic and murmur is mild  Carotid disease 1 to 39% on Dopplers 08/2019 check next year  HLD LDL 79/22/22 on lovastatin  Shared Decision Making/Informed Consent        Medication Adjustments/Labs and Tests Ordered: Current medicines are reviewed at length with the patient today.  Concerns regarding medicines are outlined above.  Medication changes, Labs and Tests ordered today are listed in the Patient Instructions below. Patient Instructions  Medication Instructions:  Your physician has recommended you make the following change in your medication:   DECREASE: Atenolol to 50-25mg  daily  *If you need a refill on your cardiac medications before your next appointment, please call your pharmacy*   Lab Work: None If you have labs (blood work) drawn today and your tests are completely normal, you  will receive your results only by: [place] (if you have MyChart) OR A paper copy in the mail If you have any lab test that is abnormal or we need to change your treatment, we will call you to review the results.   Follow-Up: At [facility], you and your health needs are our priority.  As part of our continuing mission to provide you with exceptional heart care, we have created designated Provider Care Teams.  These Care Teams include your primary Cardiologist (physician) and Advanced Practice Providers (APPs -  Physician Assistants and Nurse Practitioners) who all work together to provide you with the care you need, when you need it.  We recommend signing up for the patient portal called "MyChart".  Sign up information is provided on this After Visit Summary.  MyChart is used to connect with patients for Virtual Visits (Telemedicine).  Patients are able to view lab/test results, encounter notes, upcoming appointments, etc.  Non-urgent messages can be sent to your provider as well.   To learn more about what you can do with MyChart, go to [email or web address].    Your next appointment:   6 month(s)  The format for your next appointment:   In Person  Provider:   You may see Candee Furbish, MD or one of the following Advanced Practice Providers on your designated Care Team:   Cecilie Kicks, NP   Other Instructions [place] Monitor Instructions  Your physician has requested you wear a ZIO patch monitor for 14 days.  This is a single patch monitor. [employer] supplies one patch monitor per enrollment. Additional stickers are not available. Please do not apply patch if you will be having a Nuclear Stress Test,  Echocardiogram, Cardiac CT, MRI, or Chest Xray during the period you would be wearing the  monitor. The patch cannot be worn during these tests. You cannot remove and re-apply the  ZIO XT patch monitor.  Your ZIO patch monitor will  be mailed 3 day USPS to your address  on file. It may take 3-5 days  to receive your monitor after you have been enrolled.  Once you have received your monitor, please review the enclosed instructions. Your monitor  has already been registered assigning a specific monitor serial # to you.  Billing and Patient Assistance Program Information  We have supplied [employer] with any of your insurance information on file for billing purposes. [employer] offers a sliding scale Patient Assistance Program for patients that do not have  insurance, or whose insurance does not completely cover the cost of the ZIO monitor.  You must apply for the Patient Assistance Program to qualify for this discounted rate.  To apply, please call [employer] at [phone], select option 4, select option 2, ask to apply for  Patient Assistance Program. Theodore Demark will ask your household income, and how many people  are in your household. They will quote your out-of-pocket cost based on that information.  [employer] will also be able to set up a 25-month, interest-free payment plan if needed.  Applying the monitor   Shave hair from upper left chest.  Hold abrader disc by orange tab. Rub abrader in 40 strokes over the upper left chest as  indicated in your monitor instructions.  Clean area with 4 enclosed alcohol pads. Let dry.  Apply patch as indicated in monitor instructions. Patch will be placed under collarbone on left  side of chest with arrow pointing upward.  Rub patch adhesive wings for 2 minutes. Remove white label marked "1". Remove the white  label marked "2". Rub patch adhesive wings for 2 additional minutes.  While looking in a mirror, press and release button in center of patch. A small green light will  flash 3-4 times. This will be your only indicator that the monitor has been turned on.  Do not shower for the first 24 hours. You may shower after the first 24 hours.  Press the button if you feel a symptom. You will hear a small click. Record Date, Time  and  Symptom in the Patient Logbook.  When you are ready to remove the patch, follow instructions on the last 2 pages of Patient  Logbook. Stick patch monitor onto the last page of Patient Logbook.  Place Patient Logbook in the blue and white box. Use locking tab on box and tape box closed  securely. The blue and white box has prepaid postage on it. Please place it in the mailbox as  soon as possible. Your physician should have your test results approximately 7 days after the  monitor has been mailed back to [facility].  Call [place] at [phone] if you have questions regarding  your ZIO XT patch monitor. Call them immediately if you see an orange light blinking on your  monitor.  If your monitor falls off in less than 4 days, contact our Monitor department at [phone].  If your monitor becomes loose or falls off after 4 days call [employer] at [phone] for  suggestions on securing your monitor     Signed, Ermalinda Barrios, PA-C  04/21/2021 2:14 PM    [address], [place], [place]  [address] Phone: [phone]; Fax: [phone]

## 2021-04-16 ENCOUNTER — Other Ambulatory Visit: Payer: Self-pay | Admitting: Family Medicine

## 2021-04-16 DIAGNOSIS — R7401 Elevation of levels of liver transaminase levels: Secondary | ICD-10-CM

## 2021-04-21 ENCOUNTER — Other Ambulatory Visit: Payer: Self-pay

## 2021-04-21 ENCOUNTER — Ambulatory Visit: Payer: Medicare PPO | Admitting: Physician Assistant

## 2021-04-21 ENCOUNTER — Encounter: Payer: Self-pay | Admitting: Physician Assistant

## 2021-04-21 ENCOUNTER — Other Ambulatory Visit: Payer: Self-pay | Admitting: Physician Assistant

## 2021-04-21 ENCOUNTER — Ambulatory Visit (INDEPENDENT_AMBULATORY_CARE_PROVIDER_SITE_OTHER): Payer: Medicare PPO

## 2021-04-21 VITALS — BP 110/70 | HR 51 | Ht 65.0 in | Wt 169.0 lb

## 2021-04-21 DIAGNOSIS — I340 Nonrheumatic mitral (valve) insufficiency: Secondary | ICD-10-CM | POA: Diagnosis not present

## 2021-04-21 DIAGNOSIS — I493 Ventricular premature depolarization: Secondary | ICD-10-CM

## 2021-04-21 DIAGNOSIS — I6523 Occlusion and stenosis of bilateral carotid arteries: Secondary | ICD-10-CM

## 2021-04-21 DIAGNOSIS — I4819 Other persistent atrial fibrillation: Secondary | ICD-10-CM

## 2021-04-21 DIAGNOSIS — I10 Essential (primary) hypertension: Secondary | ICD-10-CM

## 2021-04-21 DIAGNOSIS — E785 Hyperlipidemia, unspecified: Secondary | ICD-10-CM

## 2021-04-21 DIAGNOSIS — R001 Bradycardia, unspecified: Secondary | ICD-10-CM

## 2021-04-21 DIAGNOSIS — I6381 Other cerebral infarction due to occlusion or stenosis of small artery: Secondary | ICD-10-CM

## 2021-04-21 MED ORDER — ATENOLOL-CHLORTHALIDONE 50-25 MG PO TABS
1.0000 | ORAL_TABLET | Freq: Every day | ORAL | 3 refills | Status: DC
Start: 2021-04-21 — End: 2022-06-08

## 2021-04-21 MED ORDER — ATENOLOL-CHLORTHALIDONE 50-25 MG PO TABS
1.0000 | ORAL_TABLET | Freq: Every day | ORAL | 3 refills | Status: DC
Start: 2021-04-21 — End: 2021-04-21

## 2021-04-21 NOTE — Addendum Note (Signed)
Addended by: Carylon Perches on: 04/21/2021 02:17 PM   Modules accepted: Orders

## 2021-04-21 NOTE — Patient Instructions (Signed)
Medication Instructions:  Your physician has recommended you make the following change in your medication:   DECREASE: Atenolol to 50-25mg  daily  *If you need a refill on your cardiac medications before your next appointment, please call your pharmacy*   Lab Work: None If you have labs (blood work) drawn today and your tests are completely normal, you will receive your results only by: [place] (if you have MyChart) OR A paper copy in the mail If you have any lab test that is abnormal or we need to change your treatment, we will call you to review the results.   Follow-Up: At [facility], you and your health needs are our priority.  As part of our continuing mission to provide you with exceptional heart care, we have created designated Provider Care Teams.  These Care Teams include your primary Cardiologist (physician) and Advanced Practice Providers (APPs -  Physician Assistants and Nurse Practitioners) who all work together to provide you with the care you need, when you need it.  We recommend signing up for the patient portal called "MyChart".  Sign up information is provided on this After Visit Summary.  MyChart is used to connect with patients for Virtual Visits (Telemedicine).  Patients are able to view lab/test results, encounter notes, upcoming appointments, etc.  Non-urgent messages can be sent to your provider as well.   To learn more about what you can do with MyChart, go to [email or web address].    Your next appointment:   6 month(s)  The format for your next appointment:   In Person  Provider:   You may see Candee Furbish, MD or one of the following Advanced Practice Providers on your designated Care Team:   Cecilie Kicks, NP   Other Instructions [place] Monitor Instructions  Your physician has requested you wear a ZIO patch monitor for 14 days.  This is a single patch monitor. [employer] supplies one patch monitor per enrollment. Additional stickers  are not available. Please do not apply patch if you will be having a Nuclear Stress Test,  Echocardiogram, Cardiac CT, MRI, or Chest Xray during the period you would be wearing the  monitor. The patch cannot be worn during these tests. You cannot remove and re-apply the  ZIO XT patch monitor.  Your ZIO patch monitor will be mailed 3 day USPS to your address on file. It may take 3-5 days  to receive your monitor after you have been enrolled.  Once you have received your monitor, please review the enclosed instructions. Your monitor  has already been registered assigning a specific monitor serial # to you.  Billing and Patient Assistance Program Information  We have supplied [employer] with any of your insurance information on file for billing purposes. [employer] offers a sliding scale Patient Assistance Program for patients that do not have  insurance, or whose insurance does not completely cover the cost of the ZIO monitor.  You must apply for the Patient Assistance Program to qualify for this discounted rate.  To apply, please call [employer] at [phone], select option 4, select option 2, ask to apply for  Patient Assistance Program. Theodore Demark will ask your household income, and how many people  are in your household. They will quote your out-of-pocket cost based on that information.  [employer] will also be able to set up a 55-month, interest-free payment plan if needed.  Applying the monitor   Shave hair from upper left chest.  Hold abrader disc by orange tab. Rub  abrader in 40 strokes over the upper left chest as  indicated in your monitor instructions.  Clean area with 4 enclosed alcohol pads. Let dry.  Apply patch as indicated in monitor instructions. Patch will be placed under collarbone on left  side of chest with arrow pointing upward.  Rub patch adhesive wings for 2 minutes. Remove white label marked "1". Remove the white  label marked "2". Rub patch adhesive wings for 2 additional  minutes.  While looking in a mirror, press and release button in center of patch. A small green light will  flash 3-4 times. This will be your only indicator that the monitor has been turned on.  Do not shower for the first 24 hours. You may shower after the first 24 hours.  Press the button if you feel a symptom. You will hear a small click. Record Date, Time and  Symptom in the Patient Logbook.  When you are ready to remove the patch, follow instructions on the last 2 pages of Patient  Logbook. Stick patch monitor onto the last page of Patient Logbook.  Place Patient Logbook in the blue and white box. Use locking tab on box and tape box closed  securely. The blue and white box has prepaid postage on it. Please place it in the mailbox as  soon as possible. Your physician should have your test results approximately 7 days after the  monitor has been mailed back to [facility].  Call [place] at [phone] if you have questions regarding  your ZIO XT patch monitor. Call them immediately if you see an orange light blinking on your  monitor.  If your monitor falls off in less than 4 days, contact our Monitor department at [phone].  If your monitor becomes loose or falls off after 4 days call [employer] at [phone] for  suggestions on securing your monitor

## 2021-04-21 NOTE — Progress Notes (Unsigned)
Patient enrolled for [employer] to mail a 14 day ZIO XT to her address on file.  Dr. Marlou Porch to read.

## 2021-05-03 ENCOUNTER — Ambulatory Visit
Admission: RE | Admit: 2021-05-03 | Discharge: 2021-05-03 | Disposition: A | Discharge status: Home / Self Care | Payer: Medicare PPO | Source: Ambulatory Visit | Attending: Family Medicine | Admitting: Family Medicine

## 2021-05-03 DIAGNOSIS — R7401 Elevation of levels of liver transaminase levels: Secondary | ICD-10-CM | POA: Diagnosis not present

## 2021-05-03 DIAGNOSIS — R001 Bradycardia, unspecified: Secondary | ICD-10-CM | POA: Diagnosis not present

## 2021-05-03 DIAGNOSIS — I493 Ventricular premature depolarization: Secondary | ICD-10-CM | POA: Diagnosis not present

## 2021-05-03 DIAGNOSIS — I4819 Other persistent atrial fibrillation: Secondary | ICD-10-CM | POA: Diagnosis not present

## 2021-05-28 DIAGNOSIS — I493 Ventricular premature depolarization: Secondary | ICD-10-CM | POA: Diagnosis not present

## 2021-05-28 DIAGNOSIS — I4819 Other persistent atrial fibrillation: Secondary | ICD-10-CM | POA: Diagnosis not present

## 2021-05-28 DIAGNOSIS — R001 Bradycardia, unspecified: Secondary | ICD-10-CM | POA: Diagnosis not present

## 2021-07-02 ENCOUNTER — Ambulatory Visit: Payer: Medicare PPO | Admitting: Podiatry

## 2021-07-30 ENCOUNTER — Encounter: Payer: Self-pay | Admitting: Podiatry

## 2021-07-30 ENCOUNTER — Other Ambulatory Visit: Payer: Self-pay

## 2021-07-30 ENCOUNTER — Ambulatory Visit (INDEPENDENT_AMBULATORY_CARE_PROVIDER_SITE_OTHER): Payer: Medicare PPO | Admitting: Podiatry

## 2021-07-30 DIAGNOSIS — F419 Anxiety disorder, unspecified: Secondary | ICD-10-CM | POA: Insufficient documentation

## 2021-07-30 DIAGNOSIS — E7800 Pure hypercholesterolemia, unspecified: Secondary | ICD-10-CM | POA: Insufficient documentation

## 2021-07-30 DIAGNOSIS — M179 Osteoarthritis of knee, unspecified: Secondary | ICD-10-CM | POA: Insufficient documentation

## 2021-07-30 DIAGNOSIS — E119 Type 2 diabetes mellitus without complications: Secondary | ICD-10-CM | POA: Diagnosis not present

## 2021-07-30 DIAGNOSIS — M79674 Pain in right toe(s): Secondary | ICD-10-CM | POA: Diagnosis not present

## 2021-07-30 DIAGNOSIS — R439 Unspecified disturbances of smell and taste: Secondary | ICD-10-CM | POA: Insufficient documentation

## 2021-07-30 DIAGNOSIS — B351 Tinea unguium: Secondary | ICD-10-CM

## 2021-07-30 DIAGNOSIS — H9190 Unspecified hearing loss, unspecified ear: Secondary | ICD-10-CM | POA: Insufficient documentation

## 2021-07-30 DIAGNOSIS — M79675 Pain in left toe(s): Secondary | ICD-10-CM

## 2021-07-30 DIAGNOSIS — E041 Nontoxic single thyroid nodule: Secondary | ICD-10-CM | POA: Insufficient documentation

## 2021-07-30 DIAGNOSIS — D689 Coagulation defect, unspecified: Secondary | ICD-10-CM

## 2021-07-30 DIAGNOSIS — I6990 Unspecified sequelae of unspecified cerebrovascular disease: Secondary | ICD-10-CM | POA: Insufficient documentation

## 2021-07-30 NOTE — Progress Notes (Signed)
This patient returns to my office for at risk foot care.  This patient requires this care by a professional since this patient will be at risk due to having coagulation defect and diabetes.  Patient is taking eliquis.  This patient is unable to cut nails herself since the patient cannot reach her nails.These nails are painful walking and wearing shoes.  This patient presents for at risk foot care today.  General Appearance  Alert, conversant and in no acute stress.  Vascular  Dorsalis pedis and posterior tibial  pulses are palpable  bilaterally.  Capillary return is within normal limits  bilaterally. Temperature is within normal limits  bilaterally. Digital hair noted.  Neurologic  Senn-Weinstein monofilament wire test diminished   bilaterally. Muscle power within normal limits bilaterally.  Nails Thick disfigured discolored nails with subungual debris  from hallux to fifth toes bilaterally. No evidence of bacterial infection or drainage bilaterally.  Orthopedic  No limitations of motion  feet .  No crepitus or effusions noted.  No bony pathology or digital deformities noted.  HAV  B/L  Skin  normotropic skin with no porokeratosis noted bilaterally.  No signs of infections or ulcers noted.     Onychomycosis  Pain in right toes  Pain in left toes  Consent was obtained for treatment procedures.   Mechanical debridement of nails 1-5  bilaterally performed with a nail nipper.  Filed with dremel without incident. Patient qualifies for diabetic shoes due to DPN and HAV  B/L.   Return office visit    3 months                  Told patient to return for periodic foot care and evaluation due to potential at risk complications.   Gardiner Barefoot DPM

## 2021-08-17 ENCOUNTER — Encounter: Payer: Self-pay | Admitting: Adult Health

## 2021-08-17 ENCOUNTER — Ambulatory Visit: Payer: Medicare PPO | Admitting: Adult Health

## 2021-08-17 NOTE — Progress Notes (Deleted)
[facility] [address] [place]. [place] [address] [phone]       OFFICE FOLLOW-UP NOTE  Kelly Bates Date of Birth:  01-06-1936 Medical Record Number:  [id number]   Chief complaint: No chief complaint on file.     HPI:   Update 08/17/2021 JM: returns for 1 year stroke follow up. Overall stable without new stroke/TIA symptoms. Residual right sided numbness stable. Compliant on Eliquis and lovastatin without side effects. Blood pressure today ***.       History provided for reference purposes only Update 08/17/2020 JM: Kelly Bates returns for 80-month stroke follow-up unaccompanied.  Stable from stroke standpoint without new stroke/TIA symptoms and reports residual right leg and arm numbness with some improvement since prior visit.  She also reports occasional sensation of dizziness/lightheadedness which can occur randomly and not necessarily associated with position changes or head movements.  Remains on Eliquis and lovastatin for secondary stroke prevention without side effects.  Blood pressure today 115/70.  No further concerns at this time.  Update 02/13/2020 JM: Kelly Bates returns for stroke follow up.  Residual deficits intermittent right arm and leg numbness. Denies new stroke/TIA symptoms. Remains on eliquis without bleeding or bruising. Continues on lovastatin without myalgias.  Blood pressure today 127/78. Does not routinely monitor glucose levels at home. Advised scheduling follow-up visit with PCP for evaluation of HTN, HLD and DM. Underwent cardioversion on 10/28/2019 but unfortunately has since returned to atrial fibrillation. Ongoing complaints of intermittent lightheadedness and palpitations -present prior to stroke. Has follow-up with cardiology in November.  Initial visit 10/24/2019: Kelly Bates is a pleasant 86 year old African-American lady seen today for initial office follow-up visit following hospital consultation for stroke in February 2021.   She has past medical history of diabetes, hypertension, hyperlipidemia.  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  Later on that night she noticed her right leg was also numb.  EMS were called who found her blood pressure to be elevated but she refused to be admitted.  Next day symptoms did not resolve and she did have some palpitations which she has had irregular heart beats for several months.  She had an MRI scan of the brain done which showed subcentimeter left thalamic infarct.  CT angiogram showed some diminished branches in the left posterior cerebral artery.  Echocardiogram showed normal ejection fraction.  Cardiac monitoring showed atrial fibrillation which was of new onset.  LDL cholesterol was 47 mg percent.  Hemoglobin A1c was 6.7.  She was started on Eliquis for stroke prevention.  She states she is done well since discharge numbness is improving but she still has some numbness in the right arm as well as some clumsiness.  She gets tired more easily.  She plans on having elective cardioversion next week.  She is tolerating Eliquis well without bruising or bleeding.  Her blood pressure is well controlled today it is 117/72.  She is tolerating Mevacor well without muscle aches and pains.  She has no new complaints today.  ROS:   14 system review of systems is positive for those listed in HPI and all other systems negative  PMH:  Past Medical History:  Diagnosis Date   Arthritis    Asthma    pt denies   Asthma    Diabetes mellitus    Diverticulosis    Gout    Hypertension    Osteoporosis  Stroke [facility])     Social History:  Social History   Socioeconomic History   Marital status: Divorced    Spouse name: Not on file   Number of children: Not on file   Years of education: Not on file   Highest education level: Not on file  Occupational History   Not on file  Tobacco Use   Smoking  status: Every Day    Packs/day: 0.50    Types: Cigarettes   Smokeless tobacco: Never  Vaping Use   Vaping Use: Never used  Substance and Sexual Activity   Alcohol use: Yes    Alcohol/week: 14.0 standard drinks    Types: 14 Glasses of wine per week   Drug use: No   Sexual activity: Not Currently  Other Topics Concern   Not on file  Social History Narrative   Not on file   Social Determinants of Health   Financial Resource Strain: Not on file  Food Insecurity: Not on file  Transportation Needs: Not on file  Physical Activity: Not on file  Stress: Not on file  Social Connections: Not on file  Intimate Partner Violence: Not on file    Medications:   Current Outpatient Medications on File Prior to Visit  Medication Sig Dispense Refill   amLODipine (NORVASC) 2.5 MG tablet Take 2.5 mg by mouth daily.      atenolol-chlorthalidone (TENORETIC 50) 50-25 MG tablet Take 1 tablet by mouth daily. 90 tablet 3   ELIQUIS 5 MG TABS tablet TAKE 1 TABLET TWICE DAILY 180 tablet 1   lovastatin (MEVACOR) 40 MG tablet Take 40 mg by mouth at bedtime.      metFORMIN (GLUCOPHAGE) 500 MG tablet Take 500 mg by mouth daily.      Multiple Vitamin (MULTIVITAMIN WITH MINERALS) TABS tablet Take 1 tablet by mouth daily with breakfast.     polyvinyl alcohol (LIQUIFILM TEARS) 1.4 % ophthalmic solution Place 1 drop into both eyes every 4 (four) hours as needed for dry eyes.     potassium chloride SA (KLOR-CON) 20 MEQ tablet Take 20 mEq by mouth daily.     traMADol (ULTRAM) 50 MG tablet Take 50 mg by mouth 2 (two) times daily as needed for moderate pain.      No current facility-administered medications on file prior to visit.    Allergies:   Allergies  Allergen Reactions   Atorvastatin Other (See Comments)    Bloating     There were no vitals filed for this visit.  There is no height or weight on file to calculate BMI.   Physical Exam General: well developed, well nourished pleasant elderly  African-American lady, seated, in no evident distress Head: head normocephalic and atraumatic.  Neck: supple with no carotid or supraclavicular bruits Cardiovascular: irregular rate and rhythm, no murmurs; +2 BLE edema Musculoskeletal: no deformity Skin:  no rash/petichiae Vascular:  Normal pulses all extremities  Neurologic Exam Mental Status: Awake and fully alert. Fluent speech and language. Oriented to place and time. Recent and remote memory intact. Attention span, concentration and fund of knowledge appropriate. Mood and affect appropriate.  Cranial Nerves: Pupils equal, briskly reactive to light. Extraocular movements full without nystagmus. Visual fields full to confrontation. Hearing intact. Facial sensation intact. Face, tongue, palate moves normally and symmetrically.  Motor: Normal bulk and tone. Normal strength in all tested extremity muscles.  Diminished fine finger movements on the right.   Sensory.:  Sensory intact to light touch, vibratory and pinprick sensation throughout all  tested extremities Coordination: Rapid alternating movements normal in all extremities. Finger-to-nose and heel-to-shin performed accurately bilaterally. Gait and Station: Arises from chair without difficulty. Stance is normal. Gait demonstrates normal stride length and balance without use of assistive device Reflexes: 1+ and symmetric. Toes downgoing.        ASSESSMENT/PLAN: 86 year old African-American lady with left thalamic infarct in February 2021 likely secondary to atrial fibrillation.  Vascular risk factors of hypertension, hyperlipidemia, age and atrial fibrillation    Left thalamic infarct -Residual: right arm and leg numbness  -some improvement since prior visit -Continue Eliquis and lovastatin for secondary stroke prevention -Discussed secondary stroke prevention measures and importance of Ensure close PCP/cardiology follow-up for aggressive stroke risk factor management including HTN  (BP goal <130/90), HLD (LDL goal <70) and DM (A1c<7.0) -stroke labs 03/2021: LDL 70, A1c 6.6  Atrial fibrillation -s/p unsuccessful cardioversion 10/2019  -Remains on Eliquis 5 mg twice daily per cardiology -Does report occasional dizziness/lightheadedness as well as BLE edema -advised to discuss further with cardiology at follow-up visit.  Encouraged use of compression stockings, elevation and limiting sodium    Overall stable - follow up as needed   CC:  Koirala, Dibas, MD    I spent 30 minutes of face-to-face and non-face-to-face time with patient.  This included previsit chart review, lab review, study review, order entry, electronic health record documentation, patient education regarding history of stroke with residual deficits, importance of managing stroke risk factors and answered all other questions to patient satisfaction    Frann Rider, [facility]  [facility] [address] [place], [place] [address]  Phone [phone] Fax [phone] Note: This document was prepared with digital dictation and possible smart phrase technology. Any transcriptional errors that result from this process are unintentional.

## 2021-09-15 DIAGNOSIS — E1169 Type 2 diabetes mellitus with other specified complication: Secondary | ICD-10-CM | POA: Diagnosis not present

## 2021-09-29 ENCOUNTER — Other Ambulatory Visit: Payer: Self-pay | Admitting: Cardiology

## 2021-09-29 DIAGNOSIS — I4819 Other persistent atrial fibrillation: Secondary | ICD-10-CM

## 2021-09-29 NOTE — Telephone Encounter (Signed)
Eliquis '5mg'$  refill request received. Patient is 86 years old, weight-76.7kg, Crea-0.58 on 04/08/2021 via [facility] from [place], Diagnosis-Afib, and last seen by Ermalinda Barrios on 04/21/2021. Dose is appropriate based on dosing criteria. Will send in refill to requested pharmacy.   ?

## 2021-10-01 DIAGNOSIS — H25813 Combined forms of age-related cataract, bilateral: Secondary | ICD-10-CM | POA: Diagnosis not present

## 2021-10-01 DIAGNOSIS — H353114 Nonexudative age-related macular degeneration, right eye, advanced atrophic with subfoveal involvement: Secondary | ICD-10-CM | POA: Diagnosis not present

## 2021-10-01 DIAGNOSIS — H353122 Nonexudative age-related macular degeneration, left eye, intermediate dry stage: Secondary | ICD-10-CM | POA: Diagnosis not present

## 2021-10-01 DIAGNOSIS — H35033 Hypertensive retinopathy, bilateral: Secondary | ICD-10-CM | POA: Diagnosis not present

## 2021-10-05 DIAGNOSIS — E1169 Type 2 diabetes mellitus with other specified complication: Secondary | ICD-10-CM | POA: Diagnosis not present

## 2021-10-05 DIAGNOSIS — I700 Atherosclerosis of aorta: Secondary | ICD-10-CM | POA: Diagnosis not present

## 2021-10-05 DIAGNOSIS — E7800 Pure hypercholesterolemia, unspecified: Secondary | ICD-10-CM | POA: Diagnosis not present

## 2021-10-05 DIAGNOSIS — M179 Osteoarthritis of knee, unspecified: Secondary | ICD-10-CM | POA: Diagnosis not present

## 2021-10-05 DIAGNOSIS — Z79899 Other long term (current) drug therapy: Secondary | ICD-10-CM | POA: Diagnosis not present

## 2021-10-05 DIAGNOSIS — I4819 Other persistent atrial fibrillation: Secondary | ICD-10-CM | POA: Diagnosis not present

## 2021-10-05 DIAGNOSIS — D6869 Other thrombophilia: Secondary | ICD-10-CM | POA: Diagnosis not present

## 2021-10-05 DIAGNOSIS — I6990 Unspecified sequelae of unspecified cerebrovascular disease: Secondary | ICD-10-CM | POA: Diagnosis not present

## 2021-11-05 ENCOUNTER — Ambulatory Visit (INDEPENDENT_AMBULATORY_CARE_PROVIDER_SITE_OTHER): Payer: Medicare PPO | Admitting: Podiatry

## 2021-11-05 ENCOUNTER — Encounter: Payer: Self-pay | Admitting: Podiatry

## 2021-11-05 DIAGNOSIS — M79675 Pain in left toe(s): Secondary | ICD-10-CM | POA: Diagnosis not present

## 2021-11-05 DIAGNOSIS — M79674 Pain in right toe(s): Secondary | ICD-10-CM

## 2021-11-05 DIAGNOSIS — D689 Coagulation defect, unspecified: Secondary | ICD-10-CM | POA: Diagnosis not present

## 2021-11-05 DIAGNOSIS — B351 Tinea unguium: Secondary | ICD-10-CM | POA: Diagnosis not present

## 2021-11-05 DIAGNOSIS — E119 Type 2 diabetes mellitus without complications: Secondary | ICD-10-CM | POA: Diagnosis not present

## 2021-11-05 NOTE — Progress Notes (Signed)
This patient returns to my office for at risk foot care.  This patient requires this care by a professional since this patient will be at risk due to having coagulation defect and diabetes.  Patient is taking eliquis.  This patient is unable to cut nails herself since the patient cannot reach her nails.These nails are painful walking and wearing shoes.  This patient presents for at risk foot care today. ? ?General Appearance  Alert, conversant and in no acute stress. ? ?Vascular  Dorsalis pedis and posterior tibial  pulses are palpable  bilaterally.  Capillary return is within normal limits  bilaterally. Temperature is within normal limits  bilaterally. Digital hair noted. ? ?Neurologic  Senn-Weinstein monofilament wire test diminished   bilaterally. Muscle power within normal limits bilaterally. ? ?Nails Thick disfigured discolored nails with subungual debris  from hallux to fifth toes bilaterally. No evidence of bacterial infection or drainage bilaterally. ? ?Orthopedic  No limitations of motion  feet .  No crepitus or effusions noted.  No bony pathology or digital deformities noted.  HAV  B/L ? ?Skin  normotropic skin with no porokeratosis noted bilaterally.  No signs of infections or ulcers noted.    ? ?Onychomycosis  Pain in right toes  Pain in left toes ? ?Consent was obtained for treatment procedures.   Mechanical debridement of nails 1-5  bilaterally performed with a nail nipper.  Filed with dremel without incident. Patient qualifies for diabetic shoes due to DPN and HAV  B/L. ? ? ?Return office visit    3 months                  Told patient to return for periodic foot care and evaluation due to potential at risk complications. ? ? ?Gardiner Barefoot DPM  ?

## 2021-12-01 DIAGNOSIS — H25811 Combined forms of age-related cataract, right eye: Secondary | ICD-10-CM | POA: Diagnosis not present

## 2022-01-26 DIAGNOSIS — H269 Unspecified cataract: Secondary | ICD-10-CM | POA: Diagnosis not present

## 2022-01-26 DIAGNOSIS — H2513 Age-related nuclear cataract, bilateral: Secondary | ICD-10-CM | POA: Diagnosis not present

## 2022-01-26 DIAGNOSIS — H25812 Combined forms of age-related cataract, left eye: Secondary | ICD-10-CM | POA: Diagnosis not present

## 2022-02-07 ENCOUNTER — Ambulatory Visit (INDEPENDENT_AMBULATORY_CARE_PROVIDER_SITE_OTHER): Payer: Medicare PPO | Admitting: Podiatry

## 2022-02-07 ENCOUNTER — Encounter: Payer: Self-pay | Admitting: Podiatry

## 2022-02-07 DIAGNOSIS — M79674 Pain in right toe(s): Secondary | ICD-10-CM | POA: Diagnosis not present

## 2022-02-07 DIAGNOSIS — D689 Coagulation defect, unspecified: Secondary | ICD-10-CM | POA: Diagnosis not present

## 2022-02-07 DIAGNOSIS — B351 Tinea unguium: Secondary | ICD-10-CM | POA: Diagnosis not present

## 2022-02-07 DIAGNOSIS — E119 Type 2 diabetes mellitus without complications: Secondary | ICD-10-CM | POA: Diagnosis not present

## 2022-02-07 DIAGNOSIS — M79675 Pain in left toe(s): Secondary | ICD-10-CM

## 2022-02-07 NOTE — Progress Notes (Signed)
This patient returns to my office for at risk foot care.  This patient requires this care by a professional since this patient will be at risk due to having coagulation defect and diabetes.  Patient is taking eliquis.  This patient is unable to cut nails herself since the patient cannot reach her nails.These nails are painful walking and wearing shoes.  This patient presents for at risk foot care today.  General Appearance  Alert, conversant and in no acute stress.  Vascular  Dorsalis pedis and posterior tibial  pulses are palpable  bilaterally.  Capillary return is within normal limits  bilaterally. Temperature is within normal limits  bilaterally. Digital hair noted.  Neurologic  Senn-Weinstein monofilament wire test diminished   bilaterally. Muscle power within normal limits bilaterally.  Nails Thick disfigured discolored nails with subungual debris  from hallux to fifth toes bilaterally. No evidence of bacterial infection or drainage bilaterally.  Orthopedic  No limitations of motion  feet .  No crepitus or effusions noted.  No bony pathology or digital deformities noted.  HAV  B/L  Skin  normotropic skin with no porokeratosis noted bilaterally.  No signs of infections or ulcers noted.     Onychomycosis  Pain in right toes  Pain in left toes  Consent was obtained for treatment procedures.   Mechanical debridement of nails 1-5  bilaterally performed with a nail nipper.  Filed with dremel without incident.   Return office visit    3 months                  Told patient to return for periodic foot care and evaluation due to potential at risk complications.   Gardiner Barefoot DPM

## 2022-03-04 IMAGING — US US ABDOMEN LIMITED
1 series · 14 of 25 positions shown · non-contrast
Comparison: None.

CLINICAL DATA: Transaminitis

EXAM:
ULTRASOUND ABDOMEN LIMITED RIGHT UPPER QUADRANT

[Series 1: us abdomen limited · 0.17mm/px · 14 of 52 slices shown]
[im 1/52]
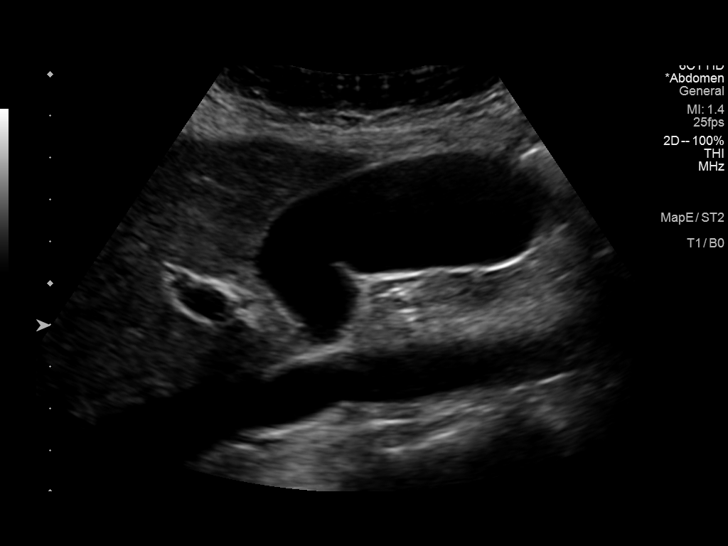
[im 5/52]
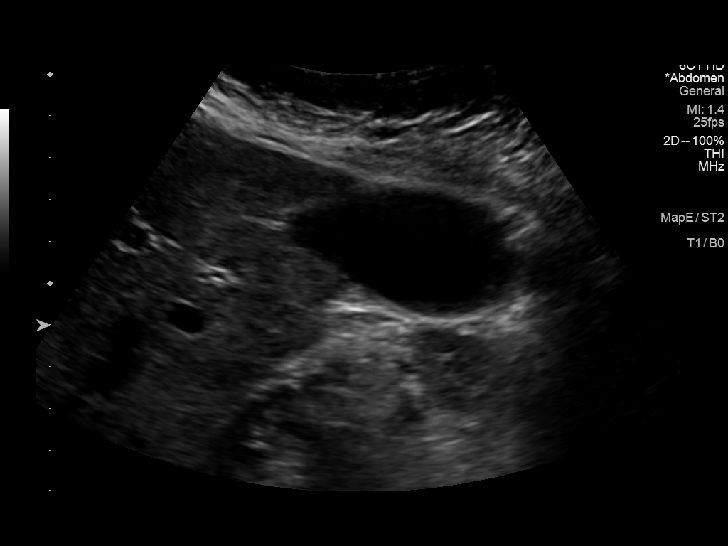
[im 9/52]
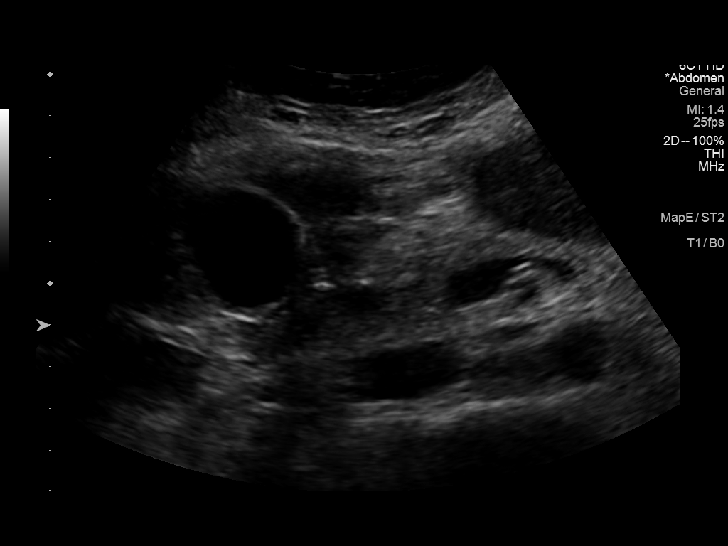
[im 13/52]
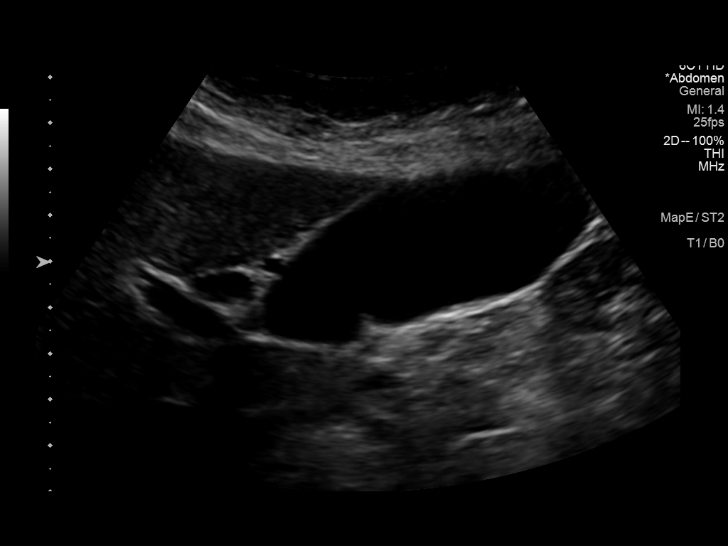
[im 18/52]
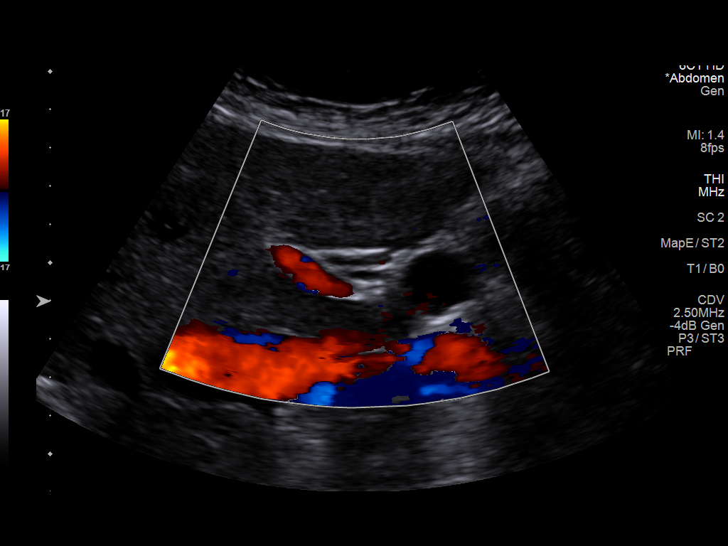
[im 20/52]
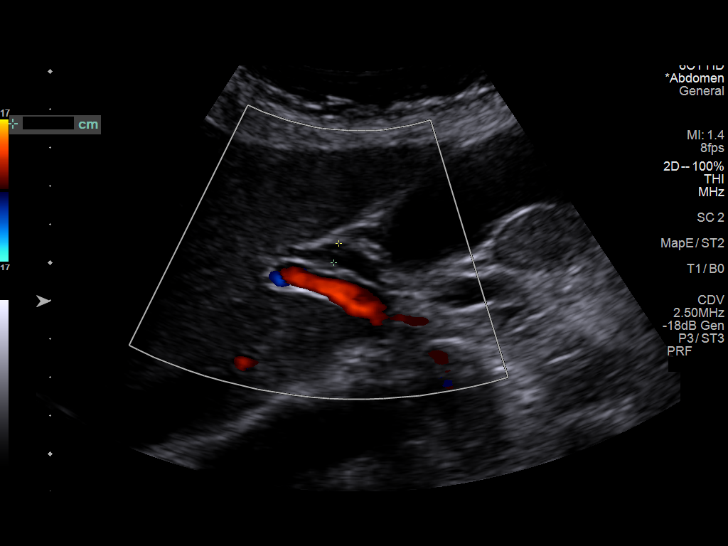
[im 24/52]
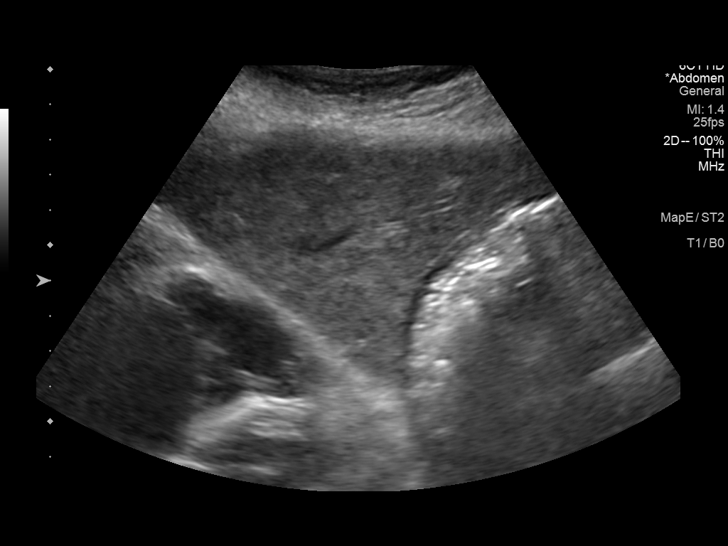
[im 28/52]
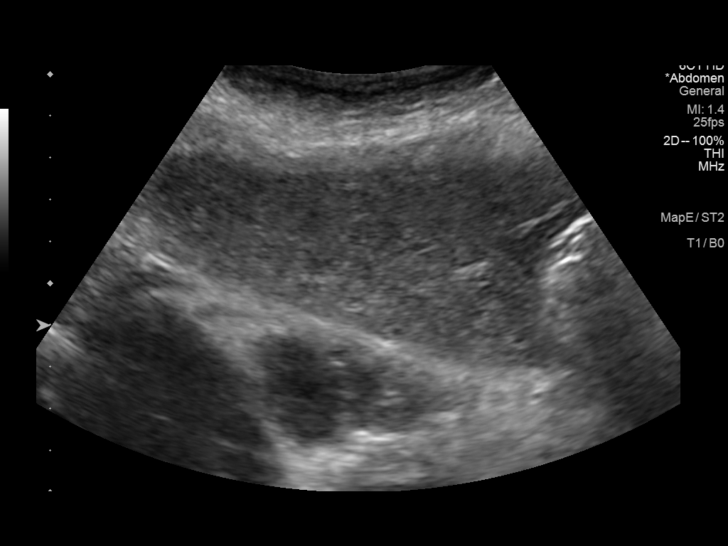
[im 32/52]
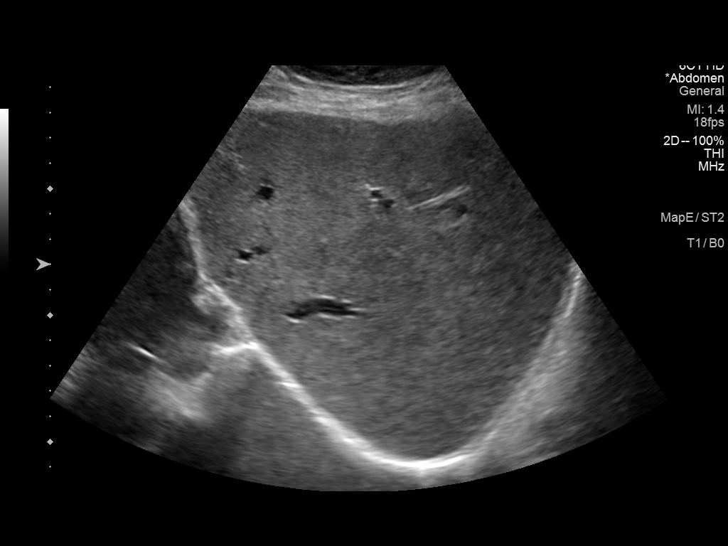
[im 35/52]
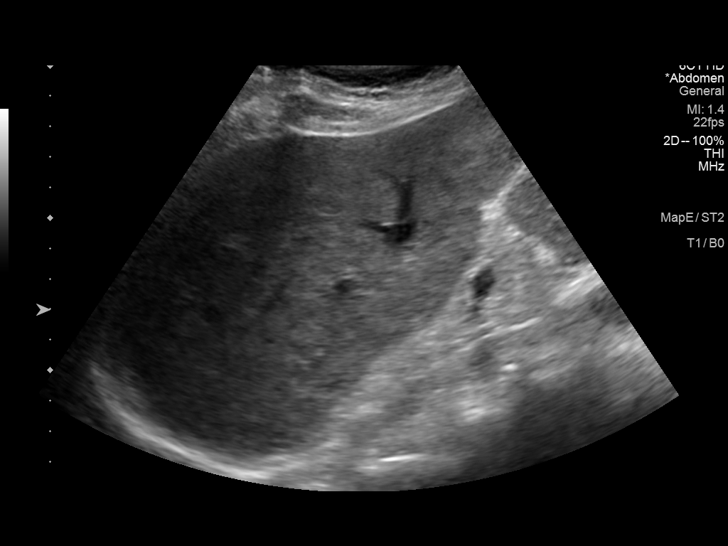
[im 39/52]
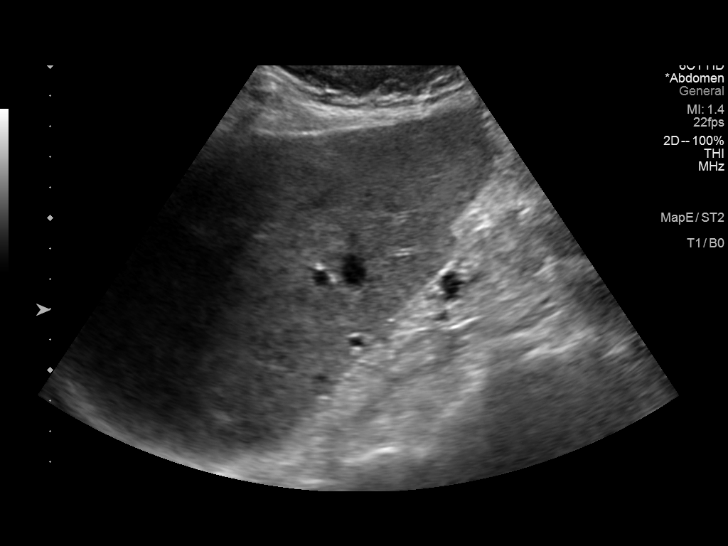
[im 43/52]
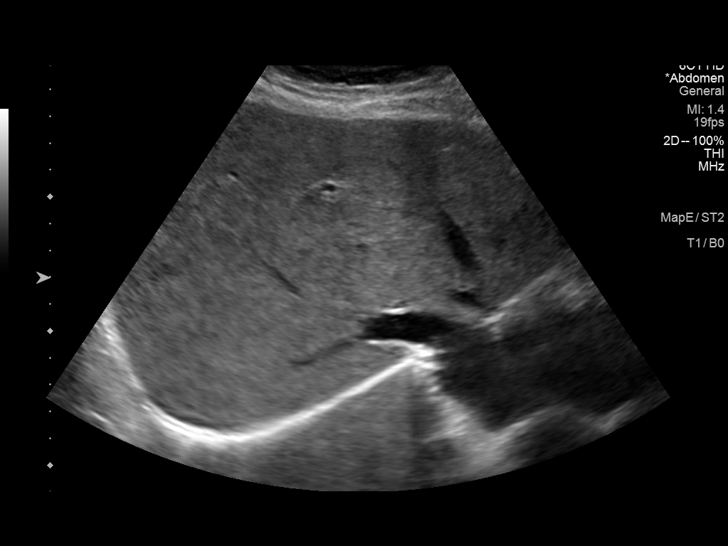
[im 47/52]
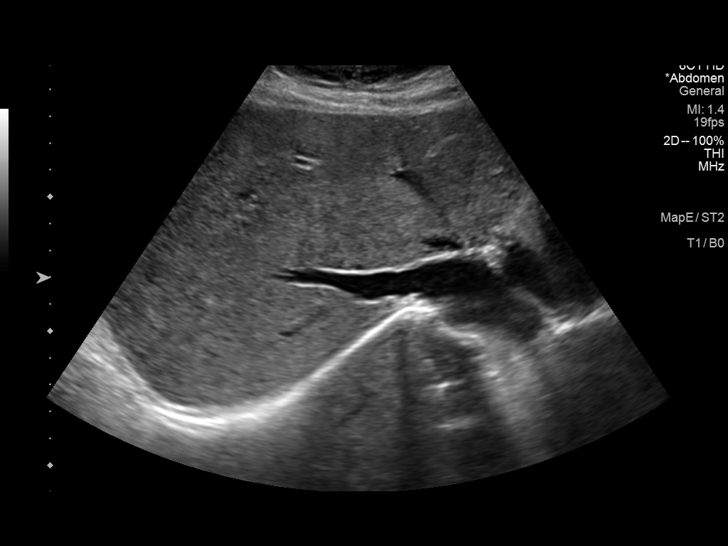
[im 52/52]
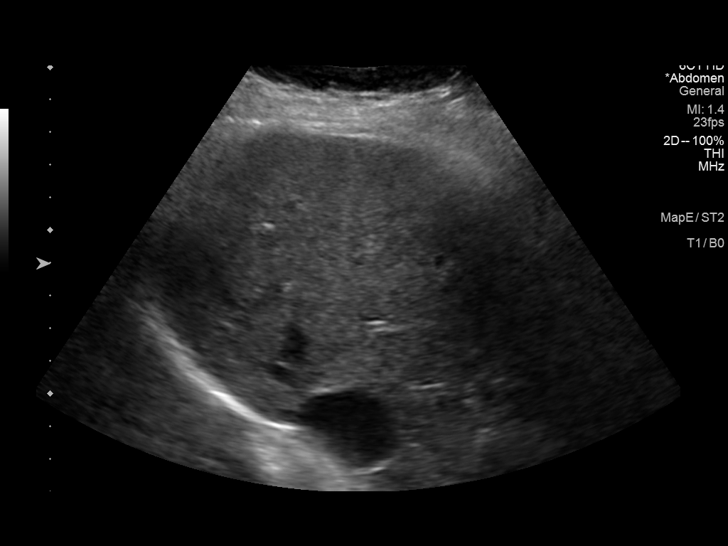

[14 of 25 positions shown; findings below may reference images not displayed]

FINDINGS: Gallbladder:

No gallstones or wall thickening visualized. No sonographic Murphy
sign noted by sonographer.

Common bile duct:

Diameter: 5 mm in proximal diameter

Liver:

No focal lesion identified. Within normal limits in parenchymal
echogenicity. Portal vein is patent on color Doppler imaging with
normal direction of blood flow towards the liver.

Other: None.
IMPRESSION: Normal right upper quadrant sonogram

## 2022-04-22 DIAGNOSIS — Z79899 Other long term (current) drug therapy: Secondary | ICD-10-CM | POA: Diagnosis not present

## 2022-04-22 DIAGNOSIS — D6869 Other thrombophilia: Secondary | ICD-10-CM | POA: Diagnosis not present

## 2022-04-22 DIAGNOSIS — E1169 Type 2 diabetes mellitus with other specified complication: Secondary | ICD-10-CM | POA: Diagnosis not present

## 2022-04-22 DIAGNOSIS — E7800 Pure hypercholesterolemia, unspecified: Secondary | ICD-10-CM | POA: Diagnosis not present

## 2022-04-22 DIAGNOSIS — Z23 Encounter for immunization: Secondary | ICD-10-CM | POA: Diagnosis not present

## 2022-04-22 DIAGNOSIS — I10 Essential (primary) hypertension: Secondary | ICD-10-CM | POA: Diagnosis not present

## 2022-04-22 DIAGNOSIS — M810 Age-related osteoporosis without current pathological fracture: Secondary | ICD-10-CM | POA: Diagnosis not present

## 2022-04-22 DIAGNOSIS — Z0001 Encounter for general adult medical examination with abnormal findings: Secondary | ICD-10-CM | POA: Diagnosis not present

## 2022-04-22 DIAGNOSIS — I4819 Other persistent atrial fibrillation: Secondary | ICD-10-CM | POA: Diagnosis not present

## 2022-04-26 ENCOUNTER — Other Ambulatory Visit: Payer: Self-pay | Admitting: Family Medicine

## 2022-04-26 DIAGNOSIS — M810 Age-related osteoporosis without current pathological fracture: Secondary | ICD-10-CM

## 2022-05-16 ENCOUNTER — Ambulatory Visit: Payer: Medicare PPO | Admitting: Podiatry

## 2022-05-31 NOTE — Progress Notes (Signed)
Cardiology Office Note:    Date:  06/14/2022   ID:  Kelly Bates, DOB 1935/10/24, MRN [id number]  PCP:  Lujean Amel, [facility] Cardiologist:  Candee Furbish, MD     Referring MD: Lujean Amel, MD   Chief Complaint:  Follow-up     History of Present Illness:   Kelly Bates is a 86 y.o. female with history of  Permanent Afib DCCV 10/2019 but back in AF at f/u and aim for rate control on Eliquis, CVA 08/2019, PVC's HTN, Mod MR on echo 08/2019, Carotid disease-1-39% doppler 08/2019,HLD.  I saw the patient 04/21/21 and having some dizziness. HR 51. I decreased tenoretic and ordered a zio. Afib 39-163/m 2 pauses longest 3.1 secs not long enough to warrant a pacemaker per Dr. Marlou Porch.  Patient comes in for yearly f/u. She notices her Afib when she first get up in the am. 30 min after she takes her tenoretic.  Labs reviewed on KPN and look good. She has occasional dizziness when she walks but not often. Much better than last year.  No bleeding problems on eliquis.          Past Medical History:  Diagnosis Date   Arthritis    Asthma    pt denies   Asthma    Diabetes mellitus    Diverticulosis    Gout    Hypertension    Osteoporosis    Stroke [facility])    Current Medications: Current Meds  Medication Sig   amLODipine (NORVASC) 2.5 MG tablet Take 2.5 mg by mouth daily.    atenolol-chlorthalidone (TENORETIC) 50-25 MG tablet Take 1 tablet by mouth daily.   ELIQUIS 5 MG TABS tablet TAKE 1 TABLET TWICE DAILY   lovastatin (MEVACOR) 40 MG tablet Take 40 mg by mouth at bedtime.    metFORMIN (GLUCOPHAGE) 500 MG tablet Take 500 mg by mouth daily.    Multiple Vitamin (MULTIVITAMIN WITH MINERALS) TABS tablet Take 1 tablet by mouth daily with breakfast.   polyvinyl alcohol (LIQUIFILM TEARS) 1.4 % ophthalmic solution Place 1 drop into both eyes every 4 (four) hours as needed for dry eyes.   potassium chloride SA (KLOR-CON) 20 MEQ tablet Take 20 mEq by mouth daily.    traMADol (ULTRAM) 50 MG tablet Take 50 mg by mouth 2 (two) times daily as needed for moderate pain.     Allergies:   Atorvastatin   Social History   Tobacco Use   Smoking status: Every Day    Packs/day: 0.50    Types: Cigarettes   Smokeless tobacco: Never  Vaping Use   Vaping Use: Never used  Substance Use Topics   Alcohol use: Yes    Alcohol/week: 14.0 standard drinks of alcohol    Types: 14 Glasses of wine per week   Drug use: No    Family Hx: The patient's family history includes CVA in her father; Diabetes in her mother; Hypertension in her mother; Lung cancer in her father. There is no history of Colon cancer, Esophageal cancer, Rectal cancer, or Stomach cancer.  ROS     Physical Exam:    VS:  BP (!) 140/80   Pulse 72   Ht 5' 5.5" (1.664 m)   Wt 181 lb (82.1 kg)   SpO2 100%   BMI 29.66 kg/m     Wt Readings from Last 3 Encounters:  06/14/22 181 lb (82.1 kg)  04/21/21 169 lb (76.7 kg)  08/17/20 162 lb 9.6 oz (73.8 kg)  Physical Exam  GEN: Well nourished, well developed, in no acute distress  Neck: no JVD, carotid bruits, or masses Cardiac:RRR; 2/6 systolic murmur Respiratory:  clear to auscultation bilaterally, normal work of breathing GI: soft, nontender, nondistended, + BS Ext: without cyanosis, clubbing, or edema, Good distal pulses bilaterally Neuro:  Alert and Oriented x 3,  Psych: euthymic mood, full affect        EKGs/Labs/Other Test Reviewed:    EKG:  EKG is   ordered today.  The ekg ordered today demonstrates Afib 77/m   Recent Labs: No results found for requested labs within last 365 days.   Recent Lipid Panel No results for input(s): "CHOL", "TRIG", "HDL", "VLDL", "LDLCALC", "LDLDIRECT" in the last 8760 hours.   Prior CV Studies:    Zio 05/28/21 Rate controlled AFIB. avg of 76 bpm Pauses are not long enough to warrant pacemaker.     Patch Wear Time:  13 days and 21 hours (2022-10-17T10:20:24-0400 to 2022-10-31T08:20:03-399)    Atrial Fibrillation occurred continuously (100% burden), ranging from 39-163 bpm (avg of 76 bpm). 2 Pauses occurred, the longest lasting 3.1 secs (19 bpm at 10:20 am). Isolated VEs were rare (<1.0%), and no VE Couplets or VE Triplets were present.    Agree with above Rate controlled AFIB.  Pauses are not long enough to warrant pacemaker.    Candee Furbish, MD    ECHO IMPRESSIONS 08/2019   1. Left ventricular ejection fraction, by estimation, is 55 to 60%. The  left ventricle has normal function. The left ventricle has no regional  wall motion abnormalities. Left ventricular diastolic parameters are  indeterminate.   2. Right ventricular systolic function is normal. The right ventricular  size is normal. There is moderately elevated pulmonary artery systolic  pressure.   3. Left atrial size was moderately dilated.   4. Right atrial size was mildly dilated.   5. The mitral valve is normal in structure and function. Mild to moderate  mitral valve regurgitation.   6. Tricuspid valve regurgitation is mild to moderate.   7. The aortic valve is tricuspid. Aortic valve regurgitation is not  visualized. No aortic stenosis is present.   8. The inferior vena cava is normal in size with greater than 50%  respiratory variability, suggesting right atrial pressure of 3 mmHg.      Risk Assessment/Calculations/Metrics:    CHA2DS2-VASc Score = 8   This indicates a 10.8% annual risk of stroke. The patient's score is based upon: CHF History: 0 HTN History: 1 Diabetes History: 1 Stroke History: 2 Vascular Disease History: 1 Age Score: 2 Gender Score: 1        HYPERTENSION CONTROL Vitals:   06/14/22 1147 06/14/22 1205  BP: (!) 140/80 (!) 140/80    The patient's blood pressure is elevated above target today.  In order to address the patient's elevated BP: Blood pressure will be monitored at home to determine if medication changes need to be made. (was rushing to get here)        ASSESSMENT & PLAN:   No problem-specific Assessment & Plan notes found for this encounter.   Permanent Afib DCCV 10/2019 but back in AF at f/u and aim for rate control on Elliquis-I decreased Tenoretic to 50/25 mg daily and place a ZIO monitor which showed Afib rate controlled with 2 pauses one 3.1 sec not long enough to warrant pacemaker. .  No bleeding problems on Eliquis. Dizziness has improved from last year.  Instructed her to take eliquis 12  hrs apart   CVA 08/2019-   PVC-no symptoms   HTN blood pressure up today but rushed to get here and couldn't find parking. She has had extra salt recently. 2 gm sodium diet and she'll keep track of it.    Mild to mod MR on echo 08/2019.  will update   Carotid disease 1 to 39% on Dopplers 08/2019  will update   HLD LDL 85 04/22/22 on lovastatin          Dispo:  No follow-ups on file.   Medication Adjustments/Labs and Tests Ordered: Current medicines are reviewed at length with the patient today.  Concerns regarding medicines are outlined above.  Tests Ordered: No orders of the defined types were placed in this encounter.  Medication Changes: No orders of the defined types were placed in this encounter.  Signed, Ermalinda Barrios, PA-C  06/14/2022 12:16 PM    [address], [place], [place]  [address] Phone: [phone]; Fax: [phone]

## 2022-06-08 ENCOUNTER — Other Ambulatory Visit: Payer: Self-pay | Admitting: Physician Assistant

## 2022-06-14 ENCOUNTER — Encounter: Payer: Self-pay | Admitting: Physician Assistant

## 2022-06-14 ENCOUNTER — Ambulatory Visit: Payer: Medicare PPO | Attending: Physician Assistant | Admitting: Physician Assistant

## 2022-06-14 VITALS — BP 140/80 | HR 72 | Ht 65.5 in | Wt 181.0 lb

## 2022-06-14 DIAGNOSIS — E785 Hyperlipidemia, unspecified: Secondary | ICD-10-CM

## 2022-06-14 DIAGNOSIS — I340 Nonrheumatic mitral (valve) insufficiency: Secondary | ICD-10-CM

## 2022-06-14 DIAGNOSIS — Z8673 Personal history of transient ischemic attack (TIA), and cerebral infarction without residual deficits: Secondary | ICD-10-CM | POA: Diagnosis not present

## 2022-06-14 DIAGNOSIS — I6523 Occlusion and stenosis of bilateral carotid arteries: Secondary | ICD-10-CM

## 2022-06-14 DIAGNOSIS — I10 Essential (primary) hypertension: Secondary | ICD-10-CM

## 2022-06-14 DIAGNOSIS — I4819 Other persistent atrial fibrillation: Secondary | ICD-10-CM | POA: Diagnosis not present

## 2022-06-14 DIAGNOSIS — I493 Ventricular premature depolarization: Secondary | ICD-10-CM

## 2022-06-14 NOTE — Patient Instructions (Signed)
Medication Instructions:  **Please take your Eliquis doses 12 hours apart *If you need a refill on your cardiac medications before your next appointment, please call your pharmacy*   Lab Work: NONE If you have labs (blood work) drawn today and your tests are completely normal, you will receive your results only by: [place] (if you have MyChart) OR A paper copy in the mail If you have any lab test that is abnormal or we need to change your treatment, we will call you to review the results.   Testing/Procedures: Carotid Ultrasound Your physician has requested that you have a carotid duplex. This test is an ultrasound of the carotid arteries in your neck. It looks at blood flow through these arteries that supply the brain with blood. Allow one hour for this exam. There are no restrictions or special instructions.  ECHO Your physician has requested that you have an echocardiogram. Echocardiography is a painless test that uses sound waves to create images of your heart. It provides your doctor with information about the size and shape of your heart and how well your heart's chambers and valves are working. This procedure takes approximately one hour. There are no restrictions for this procedure. Please do NOT wear cologne, perfume, aftershave, or lotions (deodorant is allowed). Please arrive 15 minutes prior to your appointment time.  Follow-Up: At [facility], you and your health needs are our priority.  As part of our continuing mission to provide you with exceptional heart care, we have created designated Provider Care Teams.  These Care Teams include your primary Cardiologist (physician) and Advanced Practice Providers (APPs -  Physician Assistants and Nurse Practitioners) who all work together to provide you with the care you need, when you need it.  We recommend signing up for the patient portal called "MyChart".  Sign up information is provided on this After Visit  Summary.  MyChart is used to connect with patients for Virtual Visits (Telemedicine).  Patients are able to view lab/test results, encounter notes, upcoming appointments, etc.  Non-urgent messages can be sent to your provider as well.   To learn more about what you can do with MyChart, go to [email or web address].    Your next appointment:   12/19/22 '@1'$ :20  The format for your next appointment:   In Person  Provider:   Candee Furbish, MD      Other Instructions **Recommend limiting sodium to '2000mg'$  (2gm) daily** **Please attempt to get 150 minutes per week of exercise** **Monitor BP daily**  Important Information About Sugar

## 2022-06-24 ENCOUNTER — Ambulatory Visit (HOSPITAL_COMMUNITY)
Admission: RE | Admit: 2022-06-24 | Discharge: 2022-06-24 | Disposition: A | Discharge status: Home / Self Care | Payer: Medicare PPO | Source: Ambulatory Visit | Attending: Physician Assistant | Admitting: Physician Assistant

## 2022-06-24 DIAGNOSIS — I6523 Occlusion and stenosis of bilateral carotid arteries: Secondary | ICD-10-CM | POA: Diagnosis not present

## 2022-06-24 DIAGNOSIS — I10 Essential (primary) hypertension: Secondary | ICD-10-CM | POA: Diagnosis not present

## 2022-06-24 DIAGNOSIS — I493 Ventricular premature depolarization: Secondary | ICD-10-CM | POA: Diagnosis not present

## 2022-06-24 DIAGNOSIS — E785 Hyperlipidemia, unspecified: Secondary | ICD-10-CM | POA: Insufficient documentation

## 2022-06-24 DIAGNOSIS — I4819 Other persistent atrial fibrillation: Secondary | ICD-10-CM | POA: Diagnosis not present

## 2022-06-24 DIAGNOSIS — Z8673 Personal history of transient ischemic attack (TIA), and cerebral infarction without residual deficits: Secondary | ICD-10-CM | POA: Diagnosis not present

## 2022-06-24 DIAGNOSIS — I340 Nonrheumatic mitral (valve) insufficiency: Secondary | ICD-10-CM | POA: Insufficient documentation

## 2022-07-05 ENCOUNTER — Ambulatory Visit (HOSPITAL_COMMUNITY): Payer: Medicare PPO | Attending: Physician Assistant

## 2022-07-05 DIAGNOSIS — I493 Ventricular premature depolarization: Secondary | ICD-10-CM | POA: Diagnosis not present

## 2022-07-05 DIAGNOSIS — E785 Hyperlipidemia, unspecified: Secondary | ICD-10-CM | POA: Insufficient documentation

## 2022-07-05 DIAGNOSIS — I4819 Other persistent atrial fibrillation: Secondary | ICD-10-CM | POA: Insufficient documentation

## 2022-07-05 DIAGNOSIS — I340 Nonrheumatic mitral (valve) insufficiency: Secondary | ICD-10-CM | POA: Diagnosis not present

## 2022-07-05 DIAGNOSIS — Z8673 Personal history of transient ischemic attack (TIA), and cerebral infarction without residual deficits: Secondary | ICD-10-CM | POA: Diagnosis not present

## 2022-07-05 DIAGNOSIS — I10 Essential (primary) hypertension: Secondary | ICD-10-CM | POA: Insufficient documentation

## 2022-07-05 DIAGNOSIS — I6523 Occlusion and stenosis of bilateral carotid arteries: Secondary | ICD-10-CM | POA: Diagnosis not present

## 2022-07-05 LAB — ECHOCARDIOGRAM COMPLETE
Area-P 1/2: 4.55 cm2
S' Lateral: 3 cm

## 2022-07-21 ENCOUNTER — Ambulatory Visit (HOSPITAL_COMMUNITY)
Admission: EM | Admit: 2022-07-21 | Discharge: 2022-07-21 | Disposition: A | Discharge status: Home / Self Care | Payer: BC Managed Care – PPO | Attending: Family Medicine | Admitting: Family Medicine

## 2022-07-21 ENCOUNTER — Encounter (HOSPITAL_COMMUNITY): Payer: Self-pay

## 2022-07-21 DIAGNOSIS — J069 Acute upper respiratory infection, unspecified: Secondary | ICD-10-CM

## 2022-07-21 LAB — POC INFLUENZA A AND B ANTIGEN (URGENT CARE ONLY)
INFLUENZA A ANTIGEN, POC: NEGATIVE
INFLUENZA B ANTIGEN, POC: NEGATIVE

## 2022-07-21 MED ORDER — HYDROCODONE BIT-HOMATROP MBR 5-1.5 MG/5ML PO SOLN: 5.0000 mL | Freq: Four times a day (QID) | ORAL | 0 refills | Status: DC | PRN

## 2022-07-21 NOTE — ED Triage Notes (Signed)
2-3 day h/o coughing and nasal congestion.

## 2022-07-21 NOTE — Discharge Instructions (Signed)
Be aware, your cough medication may cause drowsiness. Please do not drive, operate heavy machinery or make important decisions while on this medication, it can cloud your judgement.  

## 2022-07-26 NOTE — ED Provider Notes (Signed)
[place]   [id number] 07/21/22 Arrival Time: [place]:  1. Viral URI with cough    Discussed typical duration of likely viral illness. Results for orders placed or performed during the hospital encounter of 07/21/22  POC Influenza A & B Ag (Urgent Care)  Result Value Ref Range   INFLUENZA A ANTIGEN, POC NEGATIVE NEGATIVE   INFLUENZA B ANTIGEN, POC NEGATIVE NEGATIVE   OTC symptom care as needed.  Discharge Medication List as of 07/21/2022  8:58 PM     START taking these medications   Details  HYDROcodone bit-homatropine (HYCODAN) 5-1.5 MG/5ML syrup Take 5 mLs by mouth every 6 (six) hours as needed for cough., Starting Thu 07/21/2022, Normal         Follow-up Information     Koirala, Dibas, MD.   Specialty: Family Medicine Why: As needed. Contact information: [place] [address] [facility] [place].   Specialty: Urgent Care Why: If worsening or failing to improve as anticipated. Contact information: [place] [phone]                Reviewed expectations re: course of current medical issues. Questions answered. Outlined signs and symptoms indicating need for more acute intervention. Understanding verbalized. After Visit Summary given.   SUBJECTIVE: History from: Patient. Kelly Bates is a 87 y.o. female. Reports: 2-3 day h/o coughing and nasal congestion.  Fatigued.. Denies: fever and difficulty breathing. Normal PO intake without n/v/d.  OBJECTIVE:  Vitals:   07/21/22 2039  BP: (!) 157/88  Pulse: 88  Resp: 16  Temp: (!) 101.6 F (38.7 C)  TempSrc: Oral  SpO2: 98%    General appearance: alert; no distress Eyes: PERRLA; EOMI; conjunctiva normal HENT: [place]; AT; with nasal congestion Neck: supple  Lungs: speaks full sentences without difficulty; unlabored Extremities: no edema Skin: warm and  dry Neurologic: normal gait Psychological: alert and cooperative; normal mood and affect  Labs: Results for orders placed or performed during the hospital encounter of 07/21/22  POC Influenza A & B Ag (Urgent Care)  Result Value Ref Range   INFLUENZA A ANTIGEN, POC NEGATIVE NEGATIVE   INFLUENZA B ANTIGEN, POC NEGATIVE NEGATIVE   Labs Reviewed  POC INFLUENZA A AND B ANTIGEN (URGENT CARE ONLY)    Imaging: No results found.  Allergies  Allergen Reactions   Atorvastatin Other (See Comments)    Bloating    Past Medical History:  Diagnosis Date   Arthritis    Asthma    pt denies   Asthma    Diabetes mellitus    Diverticulosis    Gout    Hypertension    Osteoporosis    Stroke [facility])    Social History   Socioeconomic History   Marital status: Divorced    Spouse name: Not on file   Number of children: Not on file   Years of education: Not on file   Highest education level: Not on file  Occupational History   Not on file  Tobacco Use   Smoking status: Every Day    Packs/day: 0.50    Types: Cigarettes   Smokeless tobacco: Never  Vaping Use   Vaping Use: Never used  Substance and Sexual Activity   Alcohol use: Yes    Alcohol/week: 14.0 standard drinks of alcohol    Types: 14 Glasses of wine per week  Drug use: No   Sexual activity: Not Currently  Other Topics Concern   Not on file  Social History Narrative   Not on file   Social Determinants of Health   Financial Resource Strain: Not on file  Food Insecurity: Not on file  Transportation Needs: Not on file  Physical Activity: Not on file  Stress: Not on file  Social Connections: Not on file  Intimate Partner Violence: Not on file   Family History  Problem Relation Age of Onset   Diabetes Mother    Hypertension Mother    Lung cancer Father    CVA Father    Colon cancer Neg Hx    Esophageal cancer Neg Hx    Rectal cancer Neg Hx    Stomach cancer Neg Hx    Past Surgical History:  Procedure  Laterality Date   APPENDECTOMY     CARDIOVERSION N/A 10/28/2019   Procedure: CARDIOVERSION;  Surgeon: Jerline Pain, MD;  Location: [facility] ENDOSCOPY;  Service: Cardiovascular;  Laterality: N/A;   COLONOSCOPY  [phone]     Vanessa Kick, MD 07/26/22 1159

## 2022-08-16 ENCOUNTER — Other Ambulatory Visit: Payer: Self-pay | Admitting: Cardiology

## 2022-08-16 DIAGNOSIS — I4819 Other persistent atrial fibrillation: Secondary | ICD-10-CM

## 2022-08-16 NOTE — Telephone Encounter (Signed)
Eliquis '5mg'$  refill request received. Patient is 87 years old, weight-82.1kg, Crea-0.65 on 04/22/2022 via [facility] from [facility] PCP, Diagnosis-Afib, and last seen by Ermalinda Barrios on 06/14/2022. Dose is appropriate based on dosing criteria. Will send in refill to requested pharmacy.

## 2022-10-10 DIAGNOSIS — L03031 Cellulitis of right toe: Secondary | ICD-10-CM | POA: Diagnosis not present

## 2022-10-10 DIAGNOSIS — M79671 Pain in right foot: Secondary | ICD-10-CM | POA: Diagnosis not present

## 2022-10-10 DIAGNOSIS — M79672 Pain in left foot: Secondary | ICD-10-CM | POA: Diagnosis not present

## 2022-10-10 DIAGNOSIS — I739 Peripheral vascular disease, unspecified: Secondary | ICD-10-CM | POA: Diagnosis not present

## 2022-10-10 DIAGNOSIS — L600 Ingrowing nail: Secondary | ICD-10-CM | POA: Diagnosis not present

## 2022-10-10 DIAGNOSIS — E1151 Type 2 diabetes mellitus with diabetic peripheral angiopathy without gangrene: Secondary | ICD-10-CM | POA: Diagnosis not present

## 2022-10-10 DIAGNOSIS — L603 Nail dystrophy: Secondary | ICD-10-CM | POA: Diagnosis not present

## 2022-10-10 DIAGNOSIS — L03032 Cellulitis of left toe: Secondary | ICD-10-CM | POA: Diagnosis not present

## 2022-10-10 DIAGNOSIS — B351 Tinea unguium: Secondary | ICD-10-CM | POA: Diagnosis not present

## 2022-10-28 DIAGNOSIS — I700 Atherosclerosis of aorta: Secondary | ICD-10-CM | POA: Diagnosis not present

## 2022-10-28 DIAGNOSIS — E11319 Type 2 diabetes mellitus with unspecified diabetic retinopathy without macular edema: Secondary | ICD-10-CM | POA: Diagnosis not present

## 2022-10-28 DIAGNOSIS — I6990 Unspecified sequelae of unspecified cerebrovascular disease: Secondary | ICD-10-CM | POA: Diagnosis not present

## 2022-10-28 DIAGNOSIS — I10 Essential (primary) hypertension: Secondary | ICD-10-CM | POA: Diagnosis not present

## 2022-10-28 DIAGNOSIS — D6869 Other thrombophilia: Secondary | ICD-10-CM | POA: Diagnosis not present

## 2022-10-28 DIAGNOSIS — Z79899 Other long term (current) drug therapy: Secondary | ICD-10-CM | POA: Diagnosis not present

## 2022-10-28 DIAGNOSIS — E1165 Type 2 diabetes mellitus with hyperglycemia: Secondary | ICD-10-CM | POA: Diagnosis not present

## 2022-10-28 DIAGNOSIS — I4819 Other persistent atrial fibrillation: Secondary | ICD-10-CM | POA: Diagnosis not present

## 2022-10-28 DIAGNOSIS — E7800 Pure hypercholesterolemia, unspecified: Secondary | ICD-10-CM | POA: Diagnosis not present

## 2022-11-10 DIAGNOSIS — I700 Atherosclerosis of aorta: Secondary | ICD-10-CM | POA: Diagnosis not present

## 2022-11-10 DIAGNOSIS — E1151 Type 2 diabetes mellitus with diabetic peripheral angiopathy without gangrene: Secondary | ICD-10-CM | POA: Diagnosis not present

## 2022-11-10 DIAGNOSIS — I2510 Atherosclerotic heart disease of native coronary artery without angina pectoris: Secondary | ICD-10-CM | POA: Diagnosis not present

## 2022-11-10 DIAGNOSIS — E785 Hyperlipidemia, unspecified: Secondary | ICD-10-CM | POA: Diagnosis not present

## 2022-11-10 DIAGNOSIS — I509 Heart failure, unspecified: Secondary | ICD-10-CM | POA: Diagnosis not present

## 2022-11-10 DIAGNOSIS — I110 Hypertensive heart disease with heart failure: Secondary | ICD-10-CM | POA: Diagnosis not present

## 2022-11-10 DIAGNOSIS — D6869 Other thrombophilia: Secondary | ICD-10-CM | POA: Diagnosis not present

## 2022-11-10 DIAGNOSIS — I4819 Other persistent atrial fibrillation: Secondary | ICD-10-CM | POA: Diagnosis not present

## 2022-11-10 DIAGNOSIS — E663 Overweight: Secondary | ICD-10-CM | POA: Diagnosis not present

## 2022-11-16 ENCOUNTER — Inpatient Hospital Stay: Admission: RE | Admit: 2022-11-16 | Payer: Medicare PPO | Source: Ambulatory Visit

## 2022-12-19 ENCOUNTER — Encounter: Payer: Self-pay | Admitting: Cardiology

## 2022-12-19 ENCOUNTER — Ambulatory Visit: Payer: Medicare PPO | Attending: Cardiology | Admitting: Cardiology

## 2022-12-19 VITALS — BP 116/70 | HR 73 | Ht 65.5 in | Wt 174.4 lb

## 2022-12-19 DIAGNOSIS — Z8673 Personal history of transient ischemic attack (TIA), and cerebral infarction without residual deficits: Secondary | ICD-10-CM | POA: Diagnosis not present

## 2022-12-19 DIAGNOSIS — I4819 Other persistent atrial fibrillation: Secondary | ICD-10-CM

## 2022-12-19 NOTE — Patient Instructions (Signed)
Medication Instructions:  The current medical regimen is effective;  continue present plan and medications.  *If you need a refill on your cardiac medications before your next appointment, please call your pharmacy*  Follow-Up: At [facility], you and your health needs are our priority.  As part of our continuing mission to provide you with exceptional heart care, we have created designated Provider Care Teams.  These Care Teams include your primary Cardiologist (physician) and Advanced Practice Providers (APPs -  Physician Assistants and Nurse Practitioners) who all work together to provide you with the care you need, when you need it.  We recommend signing up for the patient portal called "MyChart".  Sign up information is provided on this After Visit Summary.  MyChart is used to connect with patients for Virtual Visits (Telemedicine).  Patients are able to view lab/test results, encounter notes, upcoming appointments, etc.  Non-urgent messages can be sent to your provider as well.   To learn more about what you can do with MyChart, go to [email or web address].    Your next appointment:   1 year(s)  Provider:   Mark Skains, MD      

## 2022-12-19 NOTE — Progress Notes (Signed)
Cardiology Office Note:    Date:  12/19/2022   ID:  Kelly Bates, DOB 1935/10/03, MRN [id number]  PCP:  Darrow Bussing, MD   [facility] Providers Cardiologist:  Donato Schultz, MD     Referring MD: Darrow Bussing, MD    History of Present Illness:    Kelly Bates is a 87 y.o. female here for the follow-up of permanent atrial fibrillation status post cardioversion April 2021 with early return of atrial fibrillation, rate controlled.  Eliquis, prior stroke in February 2021, PVCs.  Previously hide decreased the atenolol.  ZIO showed atrial fibrillation 39-1 63 with pauses 3.1 seconds.  Not long enough to warrant pacemaker.  Sometimes knows her A-fib when she first gets up in the morning.  She will often wake up and get her medication at 5 AM and then go back to sleep.  Overall feels well however.  No chest pain shortness of breath syncope.  No bleeding.  Past Medical History:  Diagnosis Date   Arthritis    Asthma    pt denies   Asthma    Diabetes mellitus    Diverticulosis    Gout    Hypertension    Osteoporosis    Stroke [facility])     Past Surgical History:  Procedure Laterality Date   APPENDECTOMY     CARDIOVERSION N/A 10/28/2019   Procedure: CARDIOVERSION;  Surgeon: Jake Bathe, MD;  Location: [facility] ENDOSCOPY;  Service: Cardiovascular;  Laterality: N/A;   COLONOSCOPY  05-2004    Current Medications: Current Meds  Medication Sig   apixaban (ELIQUIS) 5 MG TABS tablet TAKE 1 TABLET TWICE DAILY   atenolol-chlorthalidone (TENORETIC) 50-25 MG tablet Take 1 tablet by mouth daily.   HYDROcodone bit-homatropine (HYCODAN) 5-1.5 MG/5ML syrup Take 5 mLs by mouth every 6 (six) hours as needed for cough.   lovastatin (MEVACOR) 40 MG tablet Take 40 mg by mouth at bedtime.    metFORMIN (GLUCOPHAGE) 500 MG tablet Take 500 mg by mouth daily.    traMADol (ULTRAM) 50 MG tablet Take 50 mg by mouth 2 (two) times daily as needed for moderate pain.      Allergies:    Atorvastatin   Social History   Socioeconomic History   Marital status: Divorced    Spouse name: Not on file   Number of children: Not on file   Years of education: Not on file   Highest education level: Not on file  Occupational History   Not on file  Tobacco Use   Smoking status: Every Day    Packs/day: .5    Types: Cigarettes   Smokeless tobacco: Never  Vaping Use   Vaping Use: Never used  Substance and Sexual Activity   Alcohol use: Yes    Alcohol/week: 14.0 standard drinks of alcohol    Types: 14 Glasses of wine per week   Drug use: No   Sexual activity: Not Currently  Other Topics Concern   Not on file  Social History Narrative   Not on file   Social Determinants of Health   Financial Resource Strain: Not on file  Food Insecurity: Not on file  Transportation Needs: Not on file  Physical Activity: Not on file  Stress: Not on file  Social Connections: Not on file     Family History: The patient's family history includes CVA in her father; Diabetes in her mother; Hypertension in her mother; Lung cancer in her father. There is no history of Colon cancer, Esophageal cancer,  Rectal cancer, or Stomach cancer.  ROS:   Please see the history of present illness.     All other systems reviewed and are negative.  EKGs/Labs/Other Studies Reviewed:    The following studies were reviewed today:       Cardiac Studies & Procedures     STRESS TESTS  MYOCARDIAL PERFUSION IMAGING 10/30/2015  Narrative  Nuclear stress EF: 62%.  There was no ST segment deviation noted during stress.  The study is normal. There is no evidence of ischemia. No infarction .  This is a low risk study.   ECHOCARDIOGRAM  ECHOCARDIOGRAM COMPLETE 07/05/2022  Narrative ECHOCARDIOGRAM REPORT    Patient Name:   Kelly Bates Date of Exam: 07/05/2022 Medical Rec #:  [id number]       Height:       65.5 in Accession #:    [id number]      Weight:       181.0 lb Date of Birth:   1935-09-30        BSA:          1.907 m Patient Age:    86 years        BP:           130/70 mmHg Patient Gender: F               HR:           63 bpm. Exam Location:  [address]  Procedure: 2D Echo, Cardiac Doppler and Color Doppler  Indications:    I34.0 Mitral regurgitation  History:        Patient has prior history of Echocardiogram examinations, most recent 09/04/2019. DCCV, Carotid Disease and Stroke, Mitral Valve Disease, Arrythmias:Atrial Fibrillation and PVC; Risk Factors:Dyslipidemia. Previous echo revealed mild to moderate TR/MR, LVEF 60% moderate PAP 39mm Hg and moderate LAE.  Sonographer:    Chanetta Marshall BA, RDCS Referring Phys: [id number] Tarri Abernethy [facility]  IMPRESSIONS   1. Left ventricular ejection fraction, by estimation, is 60 to 65%. The left ventricle has normal function. The left ventricle has no regional wall motion abnormalities. There is mild left ventricular hypertrophy. Left ventricular diastolic function could not be evaluated. 2. Right ventricular systolic function is normal. The right ventricular size is normal. There is moderately elevated pulmonary artery systolic pressure. 3. Left atrial size was severely dilated. 4. Right atrial size was severely dilated. 5. The mitral valve is normal in structure. Mild mitral valve regurgitation. No evidence of mitral stenosis. 6. Tricuspid valve regurgitation is mild to moderate. 7. The aortic valve is tricuspid. Aortic valve regurgitation is not visualized. No aortic stenosis is present. 8. The inferior vena cava is dilated in size with >50% respiratory variability, suggesting right atrial pressure of 8 mmHg.  FINDINGS Left Ventricle: Left ventricular ejection fraction, by estimation, is 60 to 65%. The left ventricle has normal function. The left ventricle has no regional wall motion abnormalities. The left ventricular internal cavity size was normal in size. There is mild left ventricular hypertrophy. Left ventricular  diastolic function could not be evaluated due to atrial fibrillation. Left ventricular diastolic function could not be evaluated. Indeterminate filling pressures.  Right Ventricle: The right ventricular size is normal. No increase in right ventricular wall thickness. Right ventricular systolic function is normal. There is moderately elevated pulmonary artery systolic pressure. The tricuspid regurgitant velocity is 3.05 m/s, and with an assumed right atrial pressure of 8 mmHg, the estimated right ventricular systolic pressure is 45.2 mmHg.  Left  Atrium: Left atrial size was severely dilated.  Right Atrium: Right atrial size was severely dilated.  Pericardium: There is no evidence of pericardial effusion.  Mitral Valve: The mitral valve is normal in structure. Mild mitral valve regurgitation. No evidence of mitral valve stenosis.  Tricuspid Valve: The tricuspid valve is normal in structure. Tricuspid valve regurgitation is mild to moderate. No evidence of tricuspid stenosis.  Aortic Valve: The aortic valve is tricuspid. Aortic valve regurgitation is not visualized. No aortic stenosis is present.  Pulmonic Valve: The pulmonic valve was normal in structure. Pulmonic valve regurgitation is not visualized. No evidence of pulmonic stenosis.  Aorta: The aortic root is normal in size and structure.  Venous: The inferior vena cava is dilated in size with greater than 50% respiratory variability, suggesting right atrial pressure of 8 mmHg.  IAS/Shunts: No atrial level shunt detected by color flow Doppler.   LEFT VENTRICLE PLAX 2D LVIDd:         4.40 cm   Diastology LVIDs:         3.00 cm   LV e' medial:    9.51 cm/s LV PW:         1.20 cm   LV E/e' medial:  12.1 LV IVS:        1.00 cm   LV e' lateral:   10.75 cm/s LVOT diam:     1.90 cm   LV E/e' lateral: 10.7 LV SV:         61 LV SV Index:   32 LVOT Area:     2.84 cm   RIGHT VENTRICLE             IVC RV Basal diam:  3.90 cm     IVC  diam: 2.30 cm RV Mid diam:    3.10 cm RV S prime:     10.47 cm/s TAPSE (M-mode): 1.6 cm  LEFT ATRIUM             Index        RIGHT ATRIUM           Index LA diam:        4.90 cm 2.57 cm/m   RA Area:     26.10 cm LA Vol (A2C):   92.1 ml 48.30 ml/m  RA Volume:   83.60 ml  43.84 ml/m LA Vol (A4C):   99.3 ml 52.08 ml/m LA Biplane Vol: 96.2 ml 50.45 ml/m AORTIC VALVE LVOT Vmax:   104.00 cm/s LVOT Vmean:  69.200 cm/s LVOT VTI:    0.214 m  AORTA Ao Root diam: 2.60 cm Ao Asc diam:  2.90 cm  MITRAL VALVE                TRICUSPID VALVE MV Area (PHT): 4.55 cm     TR Peak grad:   37.2 mmHg MV Decel Time: 167 msec     TR Vmax:        305.00 cm/s MV E velocity: 115.00 cm/s  TR Vmean:       800.0 cm/s MV A velocity: 42.47 cm/s MV E/A ratio:  2.71         SHUNTS Systemic VTI:  0.21 m Systemic Diam: 1.90 cm  Chilton Si MD Electronically signed by Chilton Si MD Signature Date/Time: 07/05/2022/5:09:32 PM    Final    MONITORS  LONG TERM MONITOR (3-14 DAYS) 06/07/2021  Narrative  Rate controlled AFIB. avg of 76 bpm  Pauses are not long enough to warrant pacemaker.  Patch Wear Time:  13 days and 21 hours (2022-10-17T10:20:24-0400 to 2022-10-31T08:20:03-399)  Atrial Fibrillation occurred continuously (100% burden), ranging from 39-163 bpm (avg of 76 bpm). 2 Pauses occurred, the longest lasting 3.1 secs (19 bpm at 10:20 am). Isolated VEs were rare (<1.0%), and no VE Couplets or VE Triplets were present.  Agree with above Rate controlled AFIB. Pauses are not long enough to warrant pacemaker.  Donato Schultz, MD            EKG: No new  Recent Labs: No results found for requested labs within last 365 days.  Recent Lipid Panel    Component Value Date/Time   CHOL 134 09/05/2019 0247   TRIG 70 09/05/2019 0247   HDL 73 09/05/2019 0247   CHOLHDL 1.8 09/05/2019 0247   VLDL 14 09/05/2019 0247   LDLCALC 47 09/05/2019 0247     Risk Assessment/Calculations:                Physical Exam:    VS:  BP 116/70   Pulse 73   Ht 5' 5.5" (1.664 m)   Wt 174 lb 6.4 oz (79.1 kg)   SpO2 98%   BMI 28.58 kg/m     Wt Readings from Last 3 Encounters:  12/19/22 174 lb 6.4 oz (79.1 kg)  06/14/22 181 lb (82.1 kg)  04/21/21 169 lb (76.7 kg)     GEN:  Well nourished, well developed in no acute distress HEENT: Normal NECK: No JVD; No carotid bruits LYMPHATICS: No lymphadenopathy CARDIAC: RRR, no murmurs, rubs, gallops RESPIRATORY:  Clear to auscultation without rales, wheezing or rhonchi  ABDOMEN: Soft, non-tender, non-distended MUSCULOSKELETAL:  No edema; No deformity  SKIN: Warm and dry NEUROLOGIC:  Alert and oriented x 3 PSYCHIATRIC:  Normal affect   ASSESSMENT:    1. Persistent atrial fibrillation (HCC)   2. History of CVA (cerebrovascular accident)    PLAN:    In order of problems listed above:  Atrial fibrillation longstanding persistent -Prior cardioversion in 10/2019 but quickly back in atrial fibrillation. - Overall doing well with rate control.  Monitor reviewed.  No need for pacemaker. - Continuing to take her medications, atenolol.  Prior stroke 08/2019 - No significant residual symptoms.  Excellent.  Hyperlipidemia - LDL 64 on lovastatin 40 mg.  Mild carotid artery disease - No significant disease.          Medication Adjustments/Labs and Tests Ordered: Current medicines are reviewed at length with the patient today.  Concerns regarding medicines are outlined above.  No orders of the defined types were placed in this encounter.  No orders of the defined types were placed in this encounter.   Patient Instructions  Medication Instructions:  The current medical regimen is effective;  continue present plan and medications.  *If you need a refill on your cardiac medications before your next appointment, please call your pharmacy*  Follow-Up: At [facility], you and your health needs are our priority.   As part of our continuing mission to provide you with exceptional heart care, we have created designated Provider Care Teams.  These Care Teams include your primary Cardiologist (physician) and Advanced Practice Providers (APPs -  Physician Assistants and Nurse Practitioners) who all work together to provide you with the care you need, when you need it.  We recommend signing up for the patient portal called "MyChart".  Sign up information is provided on this After Visit Summary.  MyChart is used to connect with patients for Virtual Visits (  Telemedicine).  Patients are able to view lab/test results, encounter notes, upcoming appointments, etc.  Non-urgent messages can be sent to your provider as well.   To learn more about what you can do with MyChart, go to [email or web address].    Your next appointment:   1 year(s)  Provider:   Donato Schultz, MD        Signed, Donato Schultz, MD  12/19/2022 1:35 PM    [facility]

## 2023-01-09 DIAGNOSIS — I739 Peripheral vascular disease, unspecified: Secondary | ICD-10-CM | POA: Diagnosis not present

## 2023-01-09 DIAGNOSIS — L603 Nail dystrophy: Secondary | ICD-10-CM | POA: Diagnosis not present

## 2023-01-09 DIAGNOSIS — E1142 Type 2 diabetes mellitus with diabetic polyneuropathy: Secondary | ICD-10-CM | POA: Diagnosis not present

## 2023-01-09 DIAGNOSIS — E1151 Type 2 diabetes mellitus with diabetic peripheral angiopathy without gangrene: Secondary | ICD-10-CM | POA: Diagnosis not present

## 2023-01-29 ENCOUNTER — Other Ambulatory Visit: Payer: Self-pay | Admitting: Cardiology

## 2023-01-29 DIAGNOSIS — I4819 Other persistent atrial fibrillation: Secondary | ICD-10-CM

## 2023-01-30 NOTE — Telephone Encounter (Signed)
Prescription refill request for Eliquis received. Indication:afib Last office visit:6/24 Scr:0.67  4/24 Age: 87 Weight:79.1  kg  Prescription refilled

## 2023-02-10 ENCOUNTER — Telehealth: Payer: Self-pay | Admitting: Cardiology

## 2023-02-10 DIAGNOSIS — I4819 Other persistent atrial fibrillation: Secondary | ICD-10-CM

## 2023-02-10 MED ORDER — APIXABAN 5 MG PO TABS: 5.0000 mg | ORAL_TABLET | Freq: Two times a day (BID) | ORAL | 6 refills | Status: DC

## 2023-02-10 NOTE — Telephone Encounter (Signed)
Pt c/o medication issue:  1. Name of Medication: ELIQUIS 5 MG TABS tablet   2. How are you currently taking this medication (dosage and times per day)?    3. Are you having a reaction (difficulty breathing--STAT)? no  4. What is your medication issue? Need assistant with getting medication. Please advise

## 2023-02-10 NOTE — Telephone Encounter (Signed)
Pt is requesting a 30 day supply be sent into her local [facility] ([address]).  The cost of the 90 day supply she had been getting has tripled in cost.  This rx will be sent as requested electronically.  Also advised I am mailing her the [employer] pt assistance foundation paperwork for her to complete.  Pt was appreciative of the assistance and information.

## 2023-04-10 DIAGNOSIS — L603 Nail dystrophy: Secondary | ICD-10-CM | POA: Diagnosis not present

## 2023-04-10 DIAGNOSIS — I739 Peripheral vascular disease, unspecified: Secondary | ICD-10-CM | POA: Diagnosis not present

## 2023-04-10 DIAGNOSIS — E1142 Type 2 diabetes mellitus with diabetic polyneuropathy: Secondary | ICD-10-CM | POA: Diagnosis not present

## 2023-04-10 DIAGNOSIS — E1151 Type 2 diabetes mellitus with diabetic peripheral angiopathy without gangrene: Secondary | ICD-10-CM | POA: Diagnosis not present

## 2023-04-15 ENCOUNTER — Other Ambulatory Visit: Payer: Self-pay | Admitting: Physician Assistant

## 2023-05-31 DIAGNOSIS — I4819 Other persistent atrial fibrillation: Secondary | ICD-10-CM | POA: Diagnosis not present

## 2023-05-31 DIAGNOSIS — Z1331 Encounter for screening for depression: Secondary | ICD-10-CM | POA: Diagnosis not present

## 2023-05-31 DIAGNOSIS — D6869 Other thrombophilia: Secondary | ICD-10-CM | POA: Diagnosis not present

## 2023-05-31 DIAGNOSIS — I10 Essential (primary) hypertension: Secondary | ICD-10-CM | POA: Diagnosis not present

## 2023-05-31 DIAGNOSIS — E11319 Type 2 diabetes mellitus with unspecified diabetic retinopathy without macular edema: Secondary | ICD-10-CM | POA: Diagnosis not present

## 2023-05-31 DIAGNOSIS — Z0001 Encounter for general adult medical examination with abnormal findings: Secondary | ICD-10-CM | POA: Diagnosis not present

## 2023-05-31 DIAGNOSIS — I700 Atherosclerosis of aorta: Secondary | ICD-10-CM | POA: Diagnosis not present

## 2023-05-31 DIAGNOSIS — E7800 Pure hypercholesterolemia, unspecified: Secondary | ICD-10-CM | POA: Diagnosis not present

## 2023-05-31 DIAGNOSIS — E1165 Type 2 diabetes mellitus with hyperglycemia: Secondary | ICD-10-CM | POA: Diagnosis not present

## 2023-07-10 DIAGNOSIS — E1142 Type 2 diabetes mellitus with diabetic polyneuropathy: Secondary | ICD-10-CM | POA: Diagnosis not present

## 2023-07-10 DIAGNOSIS — L603 Nail dystrophy: Secondary | ICD-10-CM | POA: Diagnosis not present

## 2023-07-10 DIAGNOSIS — E1151 Type 2 diabetes mellitus with diabetic peripheral angiopathy without gangrene: Secondary | ICD-10-CM | POA: Diagnosis not present

## 2023-07-10 DIAGNOSIS — I739 Peripheral vascular disease, unspecified: Secondary | ICD-10-CM | POA: Diagnosis not present

## 2023-10-04 ENCOUNTER — Other Ambulatory Visit: Payer: Self-pay | Admitting: Cardiology

## 2023-10-04 DIAGNOSIS — I4819 Other persistent atrial fibrillation: Secondary | ICD-10-CM

## 2023-10-04 NOTE — Telephone Encounter (Addendum)
 Eliquis 5mg  refill request received. Patient is 88 years old, weight-79.1kg, Crea-0.56 on 06/05/23 via [facility] from [place], [place], and last seen by Dr. Anne Fu on 12/19/22. Dose is appropriate based on dosing criteria.

## 2023-12-15 DIAGNOSIS — I10 Essential (primary) hypertension: Secondary | ICD-10-CM | POA: Diagnosis not present

## 2023-12-15 DIAGNOSIS — M25551 Pain in right hip: Secondary | ICD-10-CM | POA: Diagnosis not present

## 2023-12-15 DIAGNOSIS — I4819 Other persistent atrial fibrillation: Secondary | ICD-10-CM | POA: Diagnosis not present

## 2023-12-15 DIAGNOSIS — Z79899 Other long term (current) drug therapy: Secondary | ICD-10-CM | POA: Diagnosis not present

## 2023-12-15 DIAGNOSIS — E7800 Pure hypercholesterolemia, unspecified: Secondary | ICD-10-CM | POA: Diagnosis not present

## 2023-12-15 DIAGNOSIS — E11319 Type 2 diabetes mellitus with unspecified diabetic retinopathy without macular edema: Secondary | ICD-10-CM | POA: Diagnosis not present

## 2023-12-15 DIAGNOSIS — E1165 Type 2 diabetes mellitus with hyperglycemia: Secondary | ICD-10-CM | POA: Diagnosis not present

## 2023-12-15 DIAGNOSIS — I6990 Unspecified sequelae of unspecified cerebrovascular disease: Secondary | ICD-10-CM | POA: Diagnosis not present

## 2023-12-15 DIAGNOSIS — M25511 Pain in right shoulder: Secondary | ICD-10-CM | POA: Diagnosis not present

## 2023-12-18 ENCOUNTER — Telehealth: Payer: Self-pay | Admitting: Cardiology

## 2023-12-18 MED ORDER — ATENOLOL-CHLORTHALIDONE 50-25 MG PO TABS: 1.0000 | ORAL_TABLET | Freq: Every day | ORAL | 0 refills | Status: DC

## 2023-12-18 NOTE — Telephone Encounter (Signed)
 Pt's medication was sent to pt's pharmacy as requested. Confirmation received.

## 2023-12-18 NOTE — Telephone Encounter (Signed)
*  STAT* If patient is at the pharmacy, call can be transferred to refill team.   1. Which medications need to be refilled? (please list name of each medication and dose if known) atenolol -chlorthalidone  (TENORETIC ) 50-25 MG tablet    2. Would you like to learn more about the convenience, safety, & potential cost savings by using the [facility]?    3. Are you open to using the [facility] (Type [facility].  ).   4. Which pharmacy/location (including street and city if local pharmacy) is medication to be sent to?  [id number] - [place], [place] - [address]     5. Do they need a 30 day or 90 day supply? 90 day

## 2023-12-29 ENCOUNTER — Ambulatory Visit (INDEPENDENT_AMBULATORY_CARE_PROVIDER_SITE_OTHER): Payer: Medicare (Managed Care)

## 2023-12-29 ENCOUNTER — Ambulatory Visit (HOSPITAL_COMMUNITY)
Admission: EM | Admit: 2023-12-29 | Discharge: 2023-12-29 | Disposition: A | Discharge status: Home / Self Care | Payer: Medicare (Managed Care) | Attending: Internal Medicine | Admitting: Internal Medicine

## 2023-12-29 ENCOUNTER — Encounter (HOSPITAL_COMMUNITY): Payer: Self-pay | Admitting: Emergency Medicine

## 2023-12-29 DIAGNOSIS — M25511 Pain in right shoulder: Secondary | ICD-10-CM

## 2023-12-29 MED ORDER — MELOXICAM 7.5 MG PO TABS: 7.5000 mg | ORAL_TABLET | Freq: Every day | ORAL | 0 refills | Status: DC

## 2023-12-29 NOTE — ED Provider Notes (Signed)
 [facility]    CSN: [id number] Arrival date & time: 12/29/23  1854      History   Chief Complaint Chief Complaint  Patient presents with   Shoulder Pain    HPI Kelly Bates is a 88 y.o. female.   88 year old female who presents urgent care with complaints of right shoulder pain and decreased range of motion.  She reports that 3 years ago she fell at [employer] right onto her shoulder.  The workup at that time did not reveal any signs of fracture or acute injury.  She is continue to have some problems with the shoulder since then.  A few months ago she fell again and felt that she may injured her shoulder at that time.  She did not seek any medical evaluation at that time.  In the last month she has had significant decreased range of motion especially with lifting her arm over her head or coming across her body.  She has also noticed some slight decrease grip strength.  She does have arthritis in her right hip and is scheduled to see the orthopedist for this the first part of July.   Shoulder Pain Associated symptoms: no back pain and no fever     Past Medical History:  Diagnosis Date   Arthritis    Asthma    pt denies   Asthma    Diabetes mellitus    Diverticulosis    Gout    Hypertension    Osteoporosis    Stroke [facility])     Patient Active Problem List   Diagnosis Date Noted   Anxiety 07/30/2021   Hearing loss 07/30/2021   Late effects of cerebrovascular disease 07/30/2021   Osteoarthritis of knee 07/30/2021   Pure hypercholesterolemia 07/30/2021   Sense of smell altered 07/30/2021   Thyroid  nodule 07/30/2021   Coagulation disorder (HCC) 12/03/2019   Multinodular thyroid     Stroke (cerebrum) (HCC) 09/04/2019   Persistent atrial fibrillation (HCC) 09/04/2019   Pain due to onychomycosis of toenails of both feet 01/30/2019   Diabetes mellitus without complication (HCC) 01/30/2019   Lumbar radiculopathy 05/28/2018   Full thickness rotator cuff tear  08/02/2017   Chest pain, atypical 10/23/2015   Hyperlipidemia 10/23/2015   Essential hypertension 10/23/2015   Tobacco use 10/23/2015    Past Surgical History:  Procedure Laterality Date   APPENDECTOMY     CARDIOVERSION N/A 10/28/2019   Procedure: CARDIOVERSION;  Surgeon: Hugh Madura, MD;  Location: [facility] ENDOSCOPY;  Service: Cardiovascular;  Laterality: N/A;   COLONOSCOPY  05-2004    OB History   No obstetric history on file.      Home Medications    Prior to Admission medications   Medication Sig Start Date End Date Taking? Authorizing Provider  apixaban  (ELIQUIS ) 5 MG TABS tablet TAKE 1 TABLET(5 MG) BY MOUTH TWICE DAILY 10/04/23   Hugh Madura, MD  atenolol -chlorthalidone  (TENORETIC ) 50-25 MG tablet Take 1 tablet by mouth daily. 12/18/23   Hugh Madura, MD  HYDROcodone  bit-homatropine (HYCODAN) 5-1.5 MG/5ML syrup Take 5 mLs by mouth every 6 (six) hours as needed for cough. 07/21/22   Afton Albright, MD  lovastatin (MEVACOR) 40 MG tablet Take 40 mg by mouth at bedtime.     [provider]  metFORMIN (GLUCOPHAGE) 500 MG tablet Take 500 mg by mouth daily.     [provider]  traMADol  (ULTRAM ) 50 MG tablet Take 50 mg by mouth 2 (two) times daily as needed for  moderate pain.     [provider]    Family History Family History  Problem Relation Age of Onset   Diabetes Mother    Hypertension Mother    Lung cancer Father    CVA Father    Colon cancer Neg Hx    Esophageal cancer Neg Hx    Rectal cancer Neg Hx    Stomach cancer Neg Hx     Social History Social History   Tobacco Use   Smoking status: Every Day    Current packs/day: 0.50    Types: Cigarettes   Smokeless tobacco: Never  Vaping Use   Vaping status: Never Used  Substance Use Topics   Alcohol  use: Yes    Alcohol /week: 14.0 standard drinks of alcohol     Types: 14 Glasses of wine per week   Drug use: No     Allergies   Atorvastatin   Review of Systems Review of Systems   Constitutional:  Negative for chills and fever.  HENT:  Negative for ear pain and sore throat.   Eyes:  Negative for pain and visual disturbance.  Respiratory:  Negative for cough and shortness of breath.   Cardiovascular:  Negative for chest pain and palpitations.  Gastrointestinal:  Negative for abdominal pain and vomiting.  Genitourinary:  Negative for dysuria and hematuria.  Musculoskeletal:  Negative for arthralgias and back pain.       Right shoulder pain and decreased range of motion  Skin:  Negative for color change and rash.  Neurological:  Negative for seizures and syncope.  All other systems reviewed and are negative.    Physical Exam Triage Vital Signs ED Triage Vitals [12/29/23 1934]  Encounter Vitals Group     BP (!) 152/90     Girls Systolic BP Percentile      Girls Diastolic BP Percentile      Boys Systolic BP Percentile      Boys Diastolic BP Percentile      Pulse Rate 61     Resp 17     Temp 98.1 F (36.7 C)     Temp Source Oral     SpO2 95 %     Weight      Height      Head Circumference      Peak Flow      Pain Score      Pain Loc      Pain Education      Exclude from Growth Chart    No data found.  Updated Vital Signs BP (!) 152/90 (BP Location: Left Arm)   Pulse 61   Temp 98.1 F (36.7 C) (Oral)   Resp 17   SpO2 95%   Visual Acuity Right Eye Distance:   Left Eye Distance:   Bilateral Distance:    Right Eye Near:   Left Eye Near:    Bilateral Near:     Physical Exam Vitals and nursing note reviewed.  Constitutional:      General: She is not in acute distress.    Appearance: She is well-developed.  HENT:     Head: Normocephalic and atraumatic.   Eyes:     Conjunctiva/sclera: Conjunctivae normal.    Cardiovascular:     Rate and Rhythm: Normal rate and regular rhythm.     Heart sounds: No murmur heard. Pulmonary:     Effort: Pulmonary effort is normal. No respiratory distress.     Breath sounds: Normal breath sounds.   Abdominal:  Palpations: Abdomen is soft.     Tenderness: There is no abdominal tenderness.   Musculoskeletal:        General: No swelling.     Right shoulder: Tenderness and bony tenderness present. No swelling. Decreased range of motion (With abduction and cross body adduction). Decreased strength (Mild decreased grip strength). Normal pulse.     Cervical back: Neck supple.   Skin:    General: Skin is warm and dry.     Capillary Refill: Capillary refill takes less than 2 seconds.   Neurological:     Mental Status: She is alert.   Psychiatric:        Mood and Affect: Mood normal.      UC Treatments / Results  Labs (all labs ordered are listed, but only abnormal results are displayed) Labs Reviewed - No data to display  EKG   Radiology No results found.  Procedures Procedures (including critical care time)  Medications Ordered in UC Medications - No data to display  Initial Impression / Assessment and Plan / UC Course  I have reviewed the triage vital signs and the nursing notes.  Pertinent labs & imaging results that were available during my care of the patient were reviewed by me and considered in my medical decision making (see chart for details).     Acute pain of right shoulder - Plan: DG Shoulder Right, DG Shoulder Right   The pain and decreased range of motion in the right shoulder is more of an acute on chronic process.  X-ray done of the right shoulder.  Final evaluation by the radiologist shows no acute process but there is degenerative changes in the shoulder.  This is likely causing the pain and decreased range of motion.  This will likely need to be evaluated by your orthopedist.  We can try a low-dose of an anti-inflammatory to see if this will improve symptoms. We will treat with the following:  Meloxicam  7.5 mg 1 tablet daily. Take this with food.  Recommend discussing the shoulder symptoms with your orthopedist at the appointment in July. Return  to urgent care or PCP if symptoms worsen or fail to resolve.    Final Clinical Impressions(s) / UC Diagnoses   Final diagnoses:  None   Discharge Instructions   None    ED Prescriptions   None    PDMP not reviewed this encounter.   Kreg Pesa, [place] 12/29/23 2020

## 2023-12-29 NOTE — ED Triage Notes (Signed)
 Pt reports that she fell at [employer] 3 years ago and couldn't ever find anything wrong with shoulder.  Reports fell a couple months ago and right shoulder has been hurting. Now unable to raise arm due to pain without help of left hand.   Reports has orthopedic appt first of July for pain in right hip.

## 2023-12-29 NOTE — Discharge Instructions (Addendum)
 X-ray done of the right shoulder.  Final evaluation by the radiologist shows no acute process but there is degenerative changes in the shoulder.  This is likely causing the pain and decreased range of motion.  This will likely need to be evaluated by your orthopedist.  We can try a low-dose of an anti-inflammatory to see if this will improve symptoms. We will treat with the following:  Meloxicam  7.5 mg 1 tablet daily. Take this with food.  Recommend discussing the shoulder symptoms with your orthopedist at the appointment in July. Return to urgent care or PCP if symptoms worsen or fail to resolve.

## 2024-01-10 NOTE — Progress Notes (Signed)
 Cardiology Office Note:  .   Date:  01/23/2024  ID:  Kelly Bates, DOB [birth year], MRN [id number] PCP: Regino Slater, MD   [facility] Providers Cardiologist:  Oneil Parchment, MD Cardiology APP:  Parthenia Olivia HERO, PA-C    History of Present Illness: .   Kelly Bates is a 88 y.o. female  with history of  Permanent Afib DCCV 10/2019 but back in AF at f/u and aim for rate control on Eliquis , CVA 08/2019, PVC's HTN, Mod MR on echo 08/2019, Carotid disease-1-39% doppler 08/2019,HLD.   I saw the patient 04/21/21 and having some dizziness. HR 51. I decreased tenoretic  and ordered a zio. Afib 39-163/m 2 pauses longest 3.1 secs not long enough to warrant a pacemaker per Dr. Parchment.  Patient last seen 12/2022.  In the am she has palpitations with improvement of taking her atenolol  at 5am. She has no trouble the rest of the day. Ankles started swelling a couple of months ago. Eats out a lot of chinese, [employer].     ROS:    Studies Reviewed: SABRA    EKG Interpretation Date/Time:  Tuesday January 23 2024 11:59:03 EDT Ventricular Rate:  75 PR Interval:    QRS Duration:  82 QT Interval:  392 QTC Calculation: 437 R Axis:   -27  Text Interpretation: Atrial fibrillation Low voltage QRS Cannot rule out Anterior infarct , age undetermined When compared with ECG of 2023 no changes Confirmed by Parthenia Olivia [phone]) on 01/23/2024 12:02:18 PM    Prior CV Studies:   Echo 06/2022 IMPRESSIONS     1. Left ventricular ejection fraction, by estimation, is 60 to 65%. The  left ventricle has normal function. The left ventricle has no regional  wall motion abnormalities. There is mild left ventricular hypertrophy.  Left ventricular diastolic function  could not be evaluated.   2. Right ventricular systolic function is normal. The right ventricular  size is normal. There is moderately elevated pulmonary artery systolic  pressure.   3. Left atrial size was severely dilated.   4. Right atrial size was severely  dilated.   5. The mitral valve is normal in structure. Mild mitral valve  regurgitation. No evidence of mitral stenosis.   6. Tricuspid valve regurgitation is mild to moderate.   7. The aortic valve is tricuspid. Aortic valve regurgitation is not  visualized. No aortic stenosis is present.   8. The inferior vena cava is dilated in size with >50% respiratory  variability, suggesting right atrial pressure of 8 mmHg.    Zio 05/28/21 Rate controlled AFIB. avg of 76 bpm Pauses are not long enough to warrant pacemaker.     Patch Wear Time:  13 days and 21 hours (2022-10-17T10:20:24-0400 to 2022-10-31T08:20:03-399)   Atrial Fibrillation occurred continuously (100% burden), ranging from 39-163 bpm (avg of 76 bpm). 2 Pauses occurred, the longest lasting 3.1 secs (19 bpm at 10:20 am). Isolated VEs were rare (<1.0%), and no VE Couplets or VE Triplets were present.    Agree with above Rate controlled AFIB.  Pauses are not long enough to warrant pacemaker.    Oneil Parchment, MD     ECHO IMPRESSIONS 08/2019   1. Left ventricular ejection fraction, by estimation, is 55 to 60%. The  left ventricle has normal function. The left ventricle has no regional  wall motion abnormalities. Left ventricular diastolic parameters are  indeterminate.   2. Right ventricular systolic function is normal. The right ventricular  size is normal. There is moderately elevated  pulmonary artery systolic  pressure.   3. Left atrial size was moderately dilated.   4. Right atrial size was mildly dilated.   5. The mitral valve is normal in structure and function. Mild to moderate  mitral valve regurgitation.   6. Tricuspid valve regurgitation is mild to moderate.   7. The aortic valve is tricuspid. Aortic valve regurgitation is not  visualized. No aortic stenosis is present.   8. The inferior vena cava is normal in size with greater than 50%  respiratory variability, suggesting right atrial pressure of 3 mmHg.      Risk Assessment/Calculations:    CHA2DS2-VASc Score = 6   This indicates a 9.7% annual risk of stroke. The patient's score is based upon: CHF History: 0 HTN History: 1 Diabetes History: 0 Stroke History: 2 Vascular Disease History: 0 Age Score: 2 Gender Score: 1            Physical Exam:   VS:  BP 133/83   Pulse 75   Temp 98.4 F (36.9 C)   Ht 5' 4.5 (1.638 m)   Wt 164 lb 3.2 oz (74.5 kg)   SpO2 97%   BMI 27.75 kg/m    Orhtostatics: No data found. Wt Readings from Last 3 Encounters:  01/23/24 164 lb 3.2 oz (74.5 kg)  12/19/22 174 lb 6.4 oz (79.1 kg)  06/14/22 181 lb (82.1 kg)    GEN: Well nourished, well developed in no acute distress NECK: No JVD; No carotid bruits CARDIAC: irreg irreg 2/6 systolic murmur LSB,no rubs, gallops RESPIRATORY:  Clear to auscultation without rales, wheezing or rhonchi  ABDOMEN: Soft, non-tender, non-distended EXTREMITIES:  bilateral ankle edema; No deformity   ASSESSMENT AND PLAN: .    Permanent Afib DCCV 10/2019 but back in AF at f/u and aim for rate control on Elliquis-I decreased Tenoretic  to 50/25 mg daily and place a ZIO monitor which showed Afib rate controlled with 2 pauses one 3.1 sec not long enough to warrant pacemaker. .  No bleeding problems on Eliquis .  Will contact PCP to get recent labs done.  -early am palpitations but resolve quickly with taking atenolol   LE edema new in the past month. Getting extra salt in diet. 2 gm sodium diet, compression stockings, update echo.  CVA 08/2019-  HTN blood pressure  normal today. 2 gm sodium diet and she'll keep track of it.    Mild to mod MR on echo 08/2021   Carotid disease near normal in 2023, won't repeat.   HLD LDL 58 05/2023 on lovastatin             Dispo: f/u in 1 yr  Signed, Olivia Pavy, PA-C

## 2024-01-16 ENCOUNTER — Ambulatory Visit (INDEPENDENT_AMBULATORY_CARE_PROVIDER_SITE_OTHER): Payer: Medicare (Managed Care) | Admitting: Podiatry

## 2024-01-16 ENCOUNTER — Encounter: Payer: Self-pay | Admitting: Podiatry

## 2024-01-16 DIAGNOSIS — M25551 Pain in right hip: Secondary | ICD-10-CM | POA: Diagnosis not present

## 2024-01-16 DIAGNOSIS — B351 Tinea unguium: Secondary | ICD-10-CM

## 2024-01-16 DIAGNOSIS — E1151 Type 2 diabetes mellitus with diabetic peripheral angiopathy without gangrene: Secondary | ICD-10-CM | POA: Diagnosis not present

## 2024-01-16 DIAGNOSIS — M79672 Pain in left foot: Secondary | ICD-10-CM

## 2024-01-16 DIAGNOSIS — M79671 Pain in right foot: Secondary | ICD-10-CM | POA: Diagnosis not present

## 2024-01-16 DIAGNOSIS — M25511 Pain in right shoulder: Secondary | ICD-10-CM | POA: Diagnosis not present

## 2024-01-16 DIAGNOSIS — I70209 Unspecified atherosclerosis of native arteries of extremities, unspecified extremity: Secondary | ICD-10-CM | POA: Diagnosis not present

## 2024-01-16 DIAGNOSIS — M1611 Unilateral primary osteoarthritis, right hip: Secondary | ICD-10-CM | POA: Diagnosis not present

## 2024-01-16 NOTE — Progress Notes (Signed)
 Patient presents for evaluation and treatment of tenderness and some redness around nails feet.  Tenderness around toes with walking and wearing shoes.  Physical exam:  General appearance: Alert, pleasant, and in no acute distress.  Vascular: Pedal pulses: DP 2/4 B/L, PT 0/4 B/L. mild edema lower legs bilaterally  Neurological:    Dermatologic:  Nails thickened, disfigured, discolored 1-5 BL with subungual debris.  Redness and hypertrophic nail folds along nail folds bilaterally but no signs of drainage or infection.  Musculoskeletal:     Diagnosis: 1. Painful onychomycotic nails 1 through 5 bilaterally. 2. Pain toes 1 through 5 bilaterally. 3.  Diabetes mellitus type 2 with PVD   Plan: Debrided onychomycotic nails 1 through 5 bilaterally.  Return 3 months

## 2024-01-23 ENCOUNTER — Encounter: Payer: Self-pay | Admitting: Physician Assistant

## 2024-01-23 ENCOUNTER — Ambulatory Visit: Payer: Medicare (Managed Care) | Attending: Cardiology | Admitting: Physician Assistant

## 2024-01-23 VITALS — BP 133/83 | HR 75 | Temp 98.4°F | Ht 64.5 in | Wt 164.2 lb

## 2024-01-23 DIAGNOSIS — I340 Nonrheumatic mitral (valve) insufficiency: Secondary | ICD-10-CM | POA: Diagnosis not present

## 2024-01-23 DIAGNOSIS — E785 Hyperlipidemia, unspecified: Secondary | ICD-10-CM

## 2024-01-23 DIAGNOSIS — Z8673 Personal history of transient ischemic attack (TIA), and cerebral infarction without residual deficits: Secondary | ICD-10-CM | POA: Diagnosis not present

## 2024-01-23 DIAGNOSIS — I6523 Occlusion and stenosis of bilateral carotid arteries: Secondary | ICD-10-CM | POA: Diagnosis not present

## 2024-01-23 DIAGNOSIS — I10 Essential (primary) hypertension: Secondary | ICD-10-CM | POA: Diagnosis not present

## 2024-01-23 DIAGNOSIS — R600 Localized edema: Secondary | ICD-10-CM

## 2024-01-23 DIAGNOSIS — I4819 Other persistent atrial fibrillation: Secondary | ICD-10-CM | POA: Diagnosis not present

## 2024-01-23 NOTE — Patient Instructions (Signed)
 Medication Instructions:  Your physician recommends that you continue on your current medications as directed. Please refer to the Current Medication list given to you today.  *If you need a refill on your cardiac medications before your next appointment, please call your pharmacy*  Lab Work: NONE If you have labs (blood work) drawn today and your tests are completely normal, you will receive your results only by: MyChart Message (if you have MyChart) OR A paper copy in the mail If you have any lab test that is abnormal or we need to change your treatment, we will call you to review the results.  Testing/Procedures: Your physician has requested that you have an echocardiogram. Echocardiography is a painless test that uses sound waves to create images of your heart. It provides your doctor with information about the size and shape of your heart and how well your heart's chambers and valves are working. This procedure takes approximately one hour. There are no restrictions for this procedure. Please do NOT wear cologne, perfume, aftershave, or lotions (deodorant is allowed). Please arrive 15 minutes prior to your appointment time.  Please note: We ask at that you not bring children with you during ultrasound (echo/ vascular) testing. Due to room size and safety concerns, children are not allowed in the ultrasound rooms during exams. Our front office staff cannot provide observation of children in our lobby area while testing is being conducted. An adult accompanying a patient to their appointment will only be allowed in the ultrasound room at the discretion of the ultrasound technician under special circumstances. We apologize for any inconvenience.   Follow-Up: At [facility], you and your health needs are our priority.  As part of our continuing mission to provide you with exceptional heart care, our providers are all part of one team.  This team includes your primary Cardiologist  (physician) and Advanced Practice Providers or APPs (Physician Assistants and Nurse Practitioners) who all work together to provide you with the care you need, when you need it.  Your next appointment:   1 year(s)  Provider:   Oneil Parchment, MD  We recommend signing up for the patient portal called MyChart.  Sign up information is provided on this After Visit Summary.  MyChart is used to connect with patients for Virtual Visits (Telemedicine).  Patients are able to view lab/test results, encounter notes, upcoming appointments, etc.  Non-urgent messages can be sent to your provider as well.   To learn more about what you can do with MyChart, go to [email or web address].

## 2024-01-28 ENCOUNTER — Other Ambulatory Visit: Payer: Self-pay

## 2024-01-28 ENCOUNTER — Ambulatory Visit (HOSPITAL_COMMUNITY)
Admission: EM | Admit: 2024-01-28 | Discharge: 2024-01-28 | Disposition: A | Discharge status: Home / Self Care | Payer: Medicare (Managed Care) | Attending: Family Medicine | Admitting: Family Medicine

## 2024-01-28 ENCOUNTER — Encounter (HOSPITAL_COMMUNITY): Payer: Self-pay | Admitting: Emergency Medicine

## 2024-01-28 DIAGNOSIS — J189 Pneumonia, unspecified organism: Secondary | ICD-10-CM | POA: Diagnosis not present

## 2024-01-28 DIAGNOSIS — I10 Essential (primary) hypertension: Secondary | ICD-10-CM

## 2024-01-28 DIAGNOSIS — R059 Cough, unspecified: Secondary | ICD-10-CM

## 2024-01-28 DIAGNOSIS — J209 Acute bronchitis, unspecified: Secondary | ICD-10-CM | POA: Diagnosis not present

## 2024-01-28 MED ORDER — LEVOFLOXACIN 750 MG PO TABS: 750.0000 mg | ORAL_TABLET | Freq: Every day | ORAL | 0 refills | Status: AC

## 2024-01-28 NOTE — Discharge Instructions (Signed)
 It was nice seeing you today. I worry you might have a lung infection. Given your history of diabetes and other comorbidities, your risk for pneumonia is high. I sent the antibiotics to your pharmacy. You can continue the current cough medication. Please call your PCP tomorrow for reassessment or see us  soon if your symptoms worsen.

## 2024-01-28 NOTE — ED Triage Notes (Signed)
 Pt c/o persistent cough and congestion for the past 5 days. Pt is concern of possible PNA.

## 2024-01-28 NOTE — ED Provider Notes (Signed)
 [facility]    CSN: [id number] Arrival date & time: 01/28/24  1139      History   Chief Complaint Chief Complaint  Patient presents with   Cough    HPI Kelly Bates is a 88 y.o. female.   The history is provided by the patient. No language interpreter was used.  Cough Cough characteristics: Sometimes, she coughs up yellowish sputum, no blood in it. Severity:  Moderate Onset quality:  Gradual Duration:  1 week Timing:  Intermittent Progression:  Unchanged Chronicity:  New Smoker: no   Context: not sick contacts and not smoke exposure   Relieved by:  Nothing Worsened by:  Nothing Ineffective treatments: Coricidin tablet and OTC cough meds. Associated symptoms: no chest pain, no diaphoresis, no eye discharge, no fever and no sinus congestion   Associated symptoms comment:  She has hx of yearly bronchitis. However, no for the last 3 years Risk factors: no recent infection and no recent travel    HTN:  Compliant with her Tenoretic  50/25 mg PO every day. Last dose was this morning. She does not check BP at home regularly. Last saw PCP 2 weeks ago and cardiology 1 week ago with no BP medicine change.  Past Medical History:  Diagnosis Date   Arthritis    Asthma    pt denies   Asthma    Diabetes mellitus    Diverticulosis    Gout    Hypertension    Osteoporosis    Stroke [facility])     Patient Active Problem List   Diagnosis Date Noted   Anxiety 07/30/2021   Hearing loss 07/30/2021   Late effects of cerebrovascular disease 07/30/2021   Osteoarthritis of knee 07/30/2021   Pure hypercholesterolemia 07/30/2021   Sense of smell altered 07/30/2021   Thyroid  nodule 07/30/2021   Coagulation disorder (HCC) 12/03/2019   Multinodular thyroid     Stroke (cerebrum) (HCC) 09/04/2019   Persistent atrial fibrillation (HCC) 09/04/2019   Pain due to onychomycosis of toenails of both feet 01/30/2019   Diabetes mellitus without complication (HCC) 01/30/2019   Lumbar  radiculopathy 05/28/2018   Full thickness rotator cuff tear 08/02/2017   Chest pain, atypical 10/23/2015   Hyperlipidemia 10/23/2015   Essential hypertension 10/23/2015   Tobacco use 10/23/2015    Past Surgical History:  Procedure Laterality Date   APPENDECTOMY     CARDIOVERSION N/A 10/28/2019   Procedure: CARDIOVERSION;  Surgeon: Jeffrie Oneil BROCKS, MD;  Location: [facility] ENDOSCOPY;  Service: Cardiovascular;  Laterality: N/A;   COLONOSCOPY  05-2004    OB History   No obstetric history on file.      Home Medications    Prior to Admission medications   Medication Sig Start Date End Date Taking? Authorizing Provider  levofloxacin  (LEVAQUIN ) 750 MG tablet Take 1 tablet (750 mg total) by mouth daily. 01/28/24  Yes Anders Cumins T, MD  apixaban  (ELIQUIS ) 5 MG TABS tablet TAKE 1 TABLET(5 MG) BY MOUTH TWICE DAILY 10/04/23   Jeffrie Oneil BROCKS, MD  atenolol -chlorthalidone  (TENORETIC ) 50-25 MG tablet Take 1 tablet by mouth daily. 12/18/23   Jeffrie Oneil BROCKS, MD  HYDROcodone  bit-homatropine (HYCODAN) 5-1.5 MG/5ML syrup Take 5 mLs by mouth every 6 (six) hours as needed for cough. 07/21/22   Rolinda Rogue, MD  lovastatin (MEVACOR) 40 MG tablet Take 40 mg by mouth at bedtime.     [provider]  meloxicam  (MOBIC ) 7.5 MG tablet Take 1 tablet (7.5 mg total) by mouth daily. 12/29/23   White,  Elizabeth A, PA-C  metFORMIN (GLUCOPHAGE) 500 MG tablet Take 500 mg by mouth daily.     [provider]  traMADol  (ULTRAM ) 50 MG tablet Take 50 mg by mouth 2 (two) times daily as needed for moderate pain.     [provider]    Family History Family History  Problem Relation Age of Onset   Diabetes Mother    Hypertension Mother    Lung cancer Father    CVA Father    Colon cancer Neg Hx    Esophageal cancer Neg Hx    Rectal cancer Neg Hx    Stomach cancer Neg Hx     Social History Social History   Tobacco Use   Smoking status: Every Day    Current packs/day: 0.50    Types: Cigarettes    Smokeless tobacco: Never  Vaping Use   Vaping status: Never Used  Substance Use Topics   Alcohol  use: Yes    Alcohol /week: 14.0 standard drinks of alcohol     Types: 14 Glasses of wine per week   Drug use: No     Allergies   Atorvastatin   Review of Systems Review of Systems  Constitutional:  Negative for diaphoresis and fever.  Eyes:  Negative for discharge.  Respiratory:  Positive for cough.   Cardiovascular:  Negative for chest pain.  All other systems reviewed and are negative.    Physical Exam Triage Vital Signs ED Triage Vitals  Encounter Vitals Group     BP 01/28/24 1206 (!) 160/85     Girls Systolic BP Percentile --      Girls Diastolic BP Percentile --      Boys Systolic BP Percentile --      Boys Diastolic BP Percentile --      Pulse Rate 01/28/24 1206 73     Resp 01/28/24 1206 18     Temp 01/28/24 1206 98.2 F (36.8 C)     Temp Source 01/28/24 1206 Oral     SpO2 01/28/24 1206 96 %     Weight --      Height --      Head Circumference --      Peak Flow --      Pain Score 01/28/24 1207 0     Pain Loc --      Pain Education --      Exclude from Growth Chart --    No data found.  Updated Vital Signs BP 129/87   Pulse 73   Temp 98.2 F (36.8 C) (Oral)   Resp 18   SpO2 96%   Visual Acuity Right Eye Distance:   Left Eye Distance:   Bilateral Distance:    Right Eye Near:   Left Eye Near:    Bilateral Near:     Physical Exam Vitals and nursing note reviewed.  Constitutional:      General: She is not in acute distress.    Appearance: She is not ill-appearing or toxic-appearing.  Cardiovascular:     Rate and Rhythm: Normal rate. Rhythm irregular.     Heart sounds: Normal heart sounds. No murmur heard. Pulmonary:     Effort: Pulmonary effort is normal. No respiratory distress.     Breath sounds: Rhonchi present. No wheezing or rales.      UC Treatments / Results  Labs (all labs ordered are listed, but only abnormal results are  displayed) Labs Reviewed - No data to display  EKG   Radiology No results found.  Procedures Procedures (including critical care time)  Medications Ordered in UC Medications - No data to display  Initial Impression / Assessment and Plan / UC Course  I have reviewed the triage vital signs and the nursing notes.  Pertinent labs & imaging results that were available during my care of the patient were reviewed by me and considered in my medical decision making (see chart for details).  Clinical Course as of 01/28/24 1304  Sun Jan 28, 2024  1302  Lungs do not sound good However, no respiratory distress, and her O2 saturation on room air looks great. Will treat for CAP Leviquin e-scribed given comorbidity No elevated QTc and kidney function looks great May continue Coricidine as needed for cough and congestion ED precaution and PCP f/u discussed She agreed with the plan [KE]  1303 Headache May be part of her cough syndrome Tylenol  as needed for headache recommended [KE]  1303 HTN BP initially elevated, but improved with recheck Continue home BP meds and f/u with PCP and cardiology as planned [KE]    Clinical Course User Index [KE] Anders Otto DASEN, MD     Final Clinical Impressions(s) / UC Diagnoses   Final diagnoses:  Cough, unspecified type  Acute bronchitis, unspecified organism  Community acquired pneumonia, unspecified laterality  Essential hypertension     Discharge Instructions      It was nice seeing you today. I worry you might have a lung infection. Given your history of diabetes and other comorbidities, your risk for pneumonia is high. I sent the antibiotics to your pharmacy. You can continue the current cough medication. Please call your PCP tomorrow for reassessment or see us  soon if your symptoms worsen.      ED Prescriptions     Medication Sig Dispense Auth. Provider   levofloxacin  (LEVAQUIN ) 750 MG tablet Take 1 tablet (750 mg total) by mouth  daily. 5 tablet Anders Otto DASEN, MD      PDMP not reviewed this encounter.   Anders Otto DASEN, MD 01/28/24 1304

## 2024-02-03 ENCOUNTER — Ambulatory Visit (INDEPENDENT_AMBULATORY_CARE_PROVIDER_SITE_OTHER): Payer: Medicare (Managed Care)

## 2024-02-03 ENCOUNTER — Encounter (HOSPITAL_COMMUNITY): Payer: Self-pay

## 2024-02-03 ENCOUNTER — Ambulatory Visit (HOSPITAL_COMMUNITY)
Admission: RE | Admit: 2024-02-03 | Discharge: 2024-02-03 | Disposition: A | Discharge status: Home / Self Care | Payer: Medicare (Managed Care) | Source: Ambulatory Visit | Attending: Neurology | Admitting: Neurology

## 2024-02-03 VITALS — BP 119/76 | HR 72 | Temp 98.1°F | Resp 16

## 2024-02-03 DIAGNOSIS — R052 Subacute cough: Secondary | ICD-10-CM

## 2024-02-03 MED ORDER — BENZONATATE 100 MG PO CAPS: 100.0000 mg | ORAL_CAPSULE | Freq: Three times a day (TID) | ORAL | 0 refills | Status: AC

## 2024-02-03 MED ORDER — MUCINEX DM 30-600 MG PO TB12: 1.0000 | ORAL_TABLET | Freq: Every day | ORAL | 0 refills | Status: AC | PRN

## 2024-02-03 NOTE — Discharge Instructions (Addendum)
 Use tessalon  pearles and mucinex  as needed for symptoms. Please follow up with your PCP in the next week as you have had a major illness and you are still having symptoms. May use Miralax or TOC colace for continued constipation. Remember to continue eating and drinking fluids throughout the day. If you have any increased cough or difficulty breathing please return to UC or go to the emergency department.

## 2024-02-03 NOTE — ED Provider Notes (Signed)
 [facility]    CSN: [id number] Arrival date & time: 02/03/24  1158      History   Chief Complaint Chief Complaint  Patient presents with   Cough    HPI Kelly Bates is a 88 y.o. female.   She was seen 7/13 for pneumonia and and was prescribed levaquin  and a cough syrup. She completed the antibiotic course and is still is using the cough syrup. Still has a productive cough but states her breathing is better. She reports a continued diminished appetite and states she has not moved her bowels in about 24 hours. She is not having any shortness of breath with activity, abnormal swelling, or hemoptysis.  She did have swelling in her lower extremities throughout the week but noticed that this has improved as well.   The history is provided by the patient.  Cough Cough characteristics:  Productive Duration:  1 week   Past Medical History:  Diagnosis Date   Arthritis    Asthma    pt denies   Asthma    Diabetes mellitus    Diverticulosis    Gout    Hypertension    Osteoporosis    Stroke [facility])     Patient Active Problem List   Diagnosis Date Noted   Anxiety 07/30/2021   Hearing loss 07/30/2021   Late effects of cerebrovascular disease 07/30/2021   Osteoarthritis of knee 07/30/2021   Pure hypercholesterolemia 07/30/2021   Sense of smell altered 07/30/2021   Thyroid  nodule 07/30/2021   Coagulation disorder (HCC) 12/03/2019   Multinodular thyroid     Stroke (cerebrum) (HCC) 09/04/2019   Persistent atrial fibrillation (HCC) 09/04/2019   Pain due to onychomycosis of toenails of both feet 01/30/2019   Diabetes mellitus without complication (HCC) 01/30/2019   Lumbar radiculopathy 05/28/2018   Full thickness rotator cuff tear 08/02/2017   Chest pain, atypical 10/23/2015   Hyperlipidemia 10/23/2015   Essential hypertension 10/23/2015   Tobacco use 10/23/2015    Past Surgical History:  Procedure Laterality Date   APPENDECTOMY     CARDIOVERSION N/A 10/28/2019    Procedure: CARDIOVERSION;  Surgeon: Jeffrie Oneil BROCKS, MD;  Location: [facility] ENDOSCOPY;  Service: Cardiovascular;  Laterality: N/A;   COLONOSCOPY  05-2004    OB History   No obstetric history on file.      Home Medications    Prior to Admission medications   Medication Sig Start Date End Date Taking? Authorizing Provider  apixaban  (ELIQUIS ) 5 MG TABS tablet TAKE 1 TABLET(5 MG) BY MOUTH TWICE DAILY 10/04/23   Jeffrie Oneil BROCKS, MD  atenolol -chlorthalidone  (TENORETIC ) 50-25 MG tablet Take 1 tablet by mouth daily. 12/18/23   Jeffrie Oneil BROCKS, MD  benzonatate  (TESSALON ) 100 MG capsule Take 1 capsule (100 mg total) by mouth every 8 (eight) hours. 02/03/24  Yes Mikala Podoll, Jorene, NP  Dextromethorphan-guaiFENesin  (MUCINEX  DM) 30-600 MG TB12 Take 1 tablet by mouth daily as needed. 02/03/24  Yes Remi Jorene, NP  levofloxacin  (LEVAQUIN ) 750 MG tablet Take 1 tablet (750 mg total) by mouth daily. 01/28/24   Anders Otto DASEN, MD  lovastatin (MEVACOR) 40 MG tablet Take 40 mg by mouth at bedtime.     [provider]  metFORMIN (GLUCOPHAGE) 500 MG tablet Take 500 mg by mouth daily.     [provider]  traMADol  (ULTRAM ) 50 MG tablet Take 50 mg by mouth 2 (two) times daily as needed for moderate pain.     [provider]    Family History Family  History  Problem Relation Age of Onset   Diabetes Mother    Hypertension Mother    Lung cancer Father    CVA Father    Colon cancer Neg Hx    Esophageal cancer Neg Hx    Rectal cancer Neg Hx    Stomach cancer Neg Hx     Social History Social History   Tobacco Use   Smoking status: Former    Current packs/day: 0.50    Types: Cigarettes   Smokeless tobacco: Never  Vaping Use   Vaping status: Never Used  Substance Use Topics   Alcohol  use: Yes    Alcohol /week: 14.0 standard drinks of alcohol     Types: 14 Glasses of wine per week    Comment: occasionally wine   Drug use: No     Allergies   Atorvastatin   Review of  Systems Review of Systems  Respiratory:  Positive for cough.      Physical Exam Triage Vital Signs ED Triage Vitals  Encounter Vitals Group     BP 02/03/24 1207 119/76     Girls Systolic BP Percentile --      Girls Diastolic BP Percentile --      Boys Systolic BP Percentile --      Boys Diastolic BP Percentile --      Pulse Rate 02/03/24 1207 72     Resp 02/03/24 1207 16     Temp 02/03/24 1207 98.1 F (36.7 C)     Temp Source 02/03/24 1207 Oral     SpO2 02/03/24 1207 96 %     Weight --      Height --      Head Circumference --      Peak Flow --      Pain Score 02/03/24 1210 0     Pain Loc --      Pain Education --      Exclude from Growth Chart --    No data found.  Updated Vital Signs BP 119/76 (BP Location: Left Arm)   Pulse 72   Temp 98.1 F (36.7 C) (Oral)   Resp 16   SpO2 96%   Visual Acuity Right Eye Distance:   Left Eye Distance:   Bilateral Distance:    Right Eye Near:   Left Eye Near:    Bilateral Near:     Physical Exam Vitals and nursing note reviewed.  Constitutional:      General: She is not in acute distress.    Appearance: She is well-developed.  HENT:     Head: Normocephalic and atraumatic.  Eyes:     Conjunctiva/sclera: Conjunctivae normal.  Cardiovascular:     Rate and Rhythm: Normal rate and regular rhythm.     Heart sounds: No murmur heard. Pulmonary:     Effort: Pulmonary effort is normal. No respiratory distress.     Breath sounds: Examination of the left-middle field reveals rhonchi. Rhonchi present.     Comments: Clears with cough Abdominal:     Palpations: Abdomen is soft.     Tenderness: There is no abdominal tenderness.  Musculoskeletal:        General: No swelling.     Cervical back: Neck supple.     Right lower leg: No edema.     Left lower leg: No edema.  Skin:    General: Skin is warm and dry.     Capillary Refill: Capillary refill takes less than 2 seconds.  Neurological:     Mental  Status: She is alert.   Psychiatric:        Mood and Affect: Mood normal.      UC Treatments / Results  Labs (all labs ordered are listed, but only abnormal results are displayed) Labs Reviewed - No data to display  EKG   Radiology DG Chest 2 View Result Date: 02/03/2024 CLINICAL DATA:  Cough. EXAM: CHEST - 2 VIEW COMPARISON:  09/04/2019 FINDINGS: Mild cardiomegaly stable. Mediastinal contours are unchanged. Aortic atherosclerosis. No focal airspace disease. Trace fluid in the fissures without significant sub pulmonic effusion. No pulmonary edema. No pneumothorax. Moderate scoliotic curvature of the spine. IMPRESSION: Chronic cardiomegaly. Trace fluid in the fissures without significant sub pulmonic effusion. Electronically Signed   By: Andrea Gasman M.D.   On: 02/03/2024 13:03    Procedures Procedures (including critical care time)  Medications Ordered in UC Medications - No data to display  Initial Impression / Assessment and Plan / UC Course  I have reviewed the triage vital signs and the nursing notes.  Pertinent labs & imaging results that were available during my care of the patient were reviewed by me and considered in my medical decision making (see chart for details).  CXR negative for focal disease or effusion.  Given continued given continued improvement will not continue antibiotics at this time but recommend follow-up with primary care provider next week given she did have a severe illness.  As needed medications given to help with cough.  Patient in agreement with plan of care and she will follow-up with her PCP this week.  Return and ED precautions given.       Final Clinical Impressions(s) / UC Diagnoses   Final diagnoses:  Subacute cough     Discharge Instructions      Use tessalon  pearles and mucinex  as needed for symptoms. Please follow up with your PCP in the next week as you have had a major illness and you are still having symptoms. May use Miralax or TOC colace for  continued constipation. Remember to continue eating and drinking fluids throughout the day. If you have any increased cough or difficulty breathing please return to UC or go to the emergency department.      ED Prescriptions     Medication Sig Dispense Auth. Provider   benzonatate  (TESSALON ) 100 MG capsule Take 1 capsule (100 mg total) by mouth every 8 (eight) hours. 21 capsule Remi Pippin, NP   Dextromethorphan-guaiFENesin  (MUCINEX  DM) 30-600 MG TB12 Take 1 tablet by mouth daily as needed. 28 tablet Remi Pippin, NP      PDMP not reviewed this encounter.   Remi Pippin, NP 02/03/24 1319

## 2024-02-03 NOTE — ED Triage Notes (Signed)
 Patient here today for a follow up of her visit last week. Patient states that she is feeling better. Patient states that she finished her medications Patient still has a productive cough and loss of appetite.

## 2024-02-08 DIAGNOSIS — M25551 Pain in right hip: Secondary | ICD-10-CM | POA: Diagnosis not present

## 2024-02-13 DIAGNOSIS — M25551 Pain in right hip: Secondary | ICD-10-CM | POA: Diagnosis not present

## 2024-02-21 DIAGNOSIS — M25551 Pain in right hip: Secondary | ICD-10-CM | POA: Diagnosis not present

## 2024-02-21 DIAGNOSIS — M25511 Pain in right shoulder: Secondary | ICD-10-CM | POA: Diagnosis not present

## 2024-03-26 ENCOUNTER — Telehealth: Payer: Self-pay | Admitting: Cardiology

## 2024-03-26 ENCOUNTER — Other Ambulatory Visit: Payer: Self-pay | Admitting: Cardiology

## 2024-03-26 ENCOUNTER — Ambulatory Visit (HOSPITAL_COMMUNITY)
Admission: RE | Admit: 2024-03-26 | Discharge: 2024-03-26 | Disposition: A | Discharge status: Home / Self Care | Payer: Medicare (Managed Care) | Source: Ambulatory Visit | Attending: Internal Medicine | Admitting: Internal Medicine

## 2024-03-26 DIAGNOSIS — I10 Essential (primary) hypertension: Secondary | ICD-10-CM | POA: Insufficient documentation

## 2024-03-26 LAB — ECHOCARDIOGRAM COMPLETE
AV Mean grad: 4 mmHg
AV Peak grad: 7 mmHg
Ao pk vel: 1.32 m/s
Area-P 1/2: 4.08 cm2
S' Lateral: 2.87 cm

## 2024-03-26 MED ORDER — ATENOLOL-CHLORTHALIDONE 50-25 MG PO TABS: 1.0000 | ORAL_TABLET | Freq: Every day | ORAL | 3 refills | Status: AC

## 2024-03-26 NOTE — Telephone Encounter (Signed)
*  STAT* If patient is at the pharmacy, call can be transferred to refill team.   1. Which medications need to be refilled? (please list name of each medication and dose if known)   atenolol -chlorthalidone  (TENORETIC ) 50-25 MG tablet    2. Which pharmacy/location (including street and city if local pharmacy) is medication to be sent to?  [id number] - [place], [place] - [address]      3. Do they need a 30 day or 90 day supply? 90 day

## 2024-03-26 NOTE — Telephone Encounter (Signed)
 RX sent in

## 2024-03-27 ENCOUNTER — Ambulatory Visit: Payer: Self-pay | Admitting: Physician Assistant

## 2024-04-05 ENCOUNTER — Telehealth: Payer: Self-pay | Admitting: Cardiology

## 2024-04-05 ENCOUNTER — Other Ambulatory Visit: Payer: Self-pay | Admitting: Cardiology

## 2024-04-05 DIAGNOSIS — I4819 Other persistent atrial fibrillation: Secondary | ICD-10-CM

## 2024-04-05 MED ORDER — APIXABAN 5 MG PO TABS: 5.0000 mg | ORAL_TABLET | Freq: Two times a day (BID) | ORAL | 1 refills | Status: AC

## 2024-04-05 NOTE — Telephone Encounter (Signed)
*  STAT* If patient is at the pharmacy, call can be transferred to refill team.   1. Which medications need to be refilled? (please list name of each medication and dose if known)   ELIQUIS  5 MG TABS tablet    2. Which pharmacy/location (including street and city if local pharmacy) is medication to be sent to?  [id number] - [place], [place] - [address]      3. Do they need a 30 day or 90 day supply? 90 day

## 2024-04-05 NOTE — Telephone Encounter (Signed)
 Prescription refill request for Eliquis  received. Indication:afib Last office visit:7/25 Scr:0.62  2025 Age: 87 Weight:74.5  kg  Prescription refilled

## 2024-04-24 ENCOUNTER — Ambulatory Visit: Payer: Medicare (Managed Care) | Admitting: Podiatry

## 2024-04-24 ENCOUNTER — Encounter: Payer: Self-pay | Admitting: Podiatry

## 2024-04-24 DIAGNOSIS — E1151 Type 2 diabetes mellitus with diabetic peripheral angiopathy without gangrene: Secondary | ICD-10-CM

## 2024-04-24 DIAGNOSIS — B351 Tinea unguium: Secondary | ICD-10-CM

## 2024-04-24 DIAGNOSIS — I70209 Unspecified atherosclerosis of native arteries of extremities, unspecified extremity: Secondary | ICD-10-CM

## 2024-04-24 NOTE — Progress Notes (Signed)
  Subjective:  Patient ID: Kelly Bates, female    DOB: 1936-03-11,  MRN: [id number]  Kelly Bates presents to clinic today for at risk foot care. Pt has h/o NIDDM with PAD and painful thick toenails that are difficult to trim. Pain interferes with ambulation. Aggravating factors include wearing enclosed shoe gear. Pain is relieved with periodic professional debridement.  Chief Complaint  Patient presents with   Diabetes    [facility] NIDDM A1C 6.9 Toenail trim. LOV with PCP 12/2023.   New problem(s): None.   PCP is Koirala, Dibas, MD.  Allergies  Allergen Reactions   Atorvastatin Other (See Comments)    Bloating    Review of Systems: Negative except as noted in the HPI.  Objective: No changes noted in today's physical examination. There were no vitals filed for this visit. SIA Kelly Bates is a pleasant 88 y.o. female WD, WN in NAD. AAO x 3.  Vascular Examination: CFT less than 3 seconds. DP pulses palpable b/l. PT pulses nonpalpable b/l. Digital hair absent. Skin temperature gradient warm to warn b/l. No ischemia or gangrene. No cyanosis or clubbing noted b/l.    Neurological Examination: Sensation grossly intact b/l with 10 gram monofilament. Vibratory sensation intact b/l.   Dermatological Examination: Pedal skin thin, shiny and atrophic b/l. No open wounds. No interdigital macerations.   Toenails 1-5 b/l thick, discolored, elongated with subungual debris and pain on dorsal palpation.   No hyperkeratotic nor porokeratotic lesions.  Musculoskeletal Examination: Muscle strength 5/5 to all lower extremity muscle groups bilaterally. No pain, crepitus or joint limitation noted with ROM b/l LE. Hammertoe(s) noted to the bilateral 2nd toes and bilateral 5th toes.. Patient ambulates with cane assistance.  Radiographs: None  Assessment/Plan: 1. 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 Type II diabetes mellitus with peripheral circulatory disorder  [facility])   Patient was evaluated and treated. All patient's and/or POA's questions/concerns addressed on today's visit. Toenails 1-5 debrided in length and girth without incident. Continue foot and shoe inspections daily. Monitor blood glucose per PCP/Endocrinologist's recommendations. Continue soft, supportive shoe gear daily. Report any pedal injuries to medical professional. Call office if there are any questions/concerns. -Patient/POA to call should there be question/concern in the interim.   Return in about 3 months (around 07/25/2024).  Delon LITTIE Merlin, DPM      [facility] LOCATION: [address] [place] [address]                   Office [phone]   [facility] LOCATION: [address] [place], [place] [address] Office [phone]

## 2024-06-06 DIAGNOSIS — E7800 Pure hypercholesterolemia, unspecified: Secondary | ICD-10-CM | POA: Diagnosis not present

## 2024-06-06 DIAGNOSIS — I4819 Other persistent atrial fibrillation: Secondary | ICD-10-CM | POA: Diagnosis not present

## 2024-06-06 DIAGNOSIS — Z23 Encounter for immunization: Secondary | ICD-10-CM | POA: Diagnosis not present

## 2024-06-06 DIAGNOSIS — E11319 Type 2 diabetes mellitus with unspecified diabetic retinopathy without macular edema: Secondary | ICD-10-CM | POA: Diagnosis not present

## 2024-06-06 DIAGNOSIS — I6990 Unspecified sequelae of unspecified cerebrovascular disease: Secondary | ICD-10-CM | POA: Diagnosis not present

## 2024-06-06 DIAGNOSIS — R0789 Other chest pain: Secondary | ICD-10-CM | POA: Diagnosis not present

## 2024-06-06 DIAGNOSIS — Z79899 Other long term (current) drug therapy: Secondary | ICD-10-CM | POA: Diagnosis not present

## 2024-06-06 DIAGNOSIS — Z0001 Encounter for general adult medical examination with abnormal findings: Secondary | ICD-10-CM | POA: Diagnosis not present

## 2024-06-06 DIAGNOSIS — I10 Essential (primary) hypertension: Secondary | ICD-10-CM | POA: Diagnosis not present

## 2024-06-06 DIAGNOSIS — E1165 Type 2 diabetes mellitus with hyperglycemia: Secondary | ICD-10-CM | POA: Diagnosis not present

## 2024-08-14 ENCOUNTER — Ambulatory Visit: Payer: Medicare (Managed Care) | Admitting: Podiatry

## 2024-08-19 ENCOUNTER — Ambulatory Visit: Admitting: Podiatry

## 2024-08-28 ENCOUNTER — Ambulatory Visit: Admitting: Podiatry

## 2024-09-12 ENCOUNTER — Ambulatory Visit: Admitting: Podiatry
